# Patient Record
Sex: Male | Born: 1990 | Hispanic: No | Marital: Married | Smoking: Never smoker
Health system: Southern US, Community
[De-identification: ages and names within clinical notes are randomized; demographics above are authoritative.]

## PROBLEM LIST (undated history)

## (undated) DIAGNOSIS — E119 Type 2 diabetes mellitus without complications: Secondary | ICD-10-CM

---

## 2016-06-21 ENCOUNTER — Emergency Department (HOSPITAL_BASED_OUTPATIENT_CLINIC_OR_DEPARTMENT_OTHER)
Admission: EM | Admit: 2016-06-21 | Discharge: 2016-06-21 | Disposition: A | Discharge status: Home / Self Care | Payer: PPO | Attending: Emergency Medicine | Admitting: Emergency Medicine

## 2016-06-21 ENCOUNTER — Encounter (HOSPITAL_BASED_OUTPATIENT_CLINIC_OR_DEPARTMENT_OTHER): Payer: Self-pay | Admitting: *Deleted

## 2016-06-21 DIAGNOSIS — E119 Type 2 diabetes mellitus without complications: Secondary | ICD-10-CM | POA: Diagnosis not present

## 2016-06-21 DIAGNOSIS — Z794 Long term (current) use of insulin: Secondary | ICD-10-CM | POA: Insufficient documentation

## 2016-06-21 DIAGNOSIS — T7840XA Allergy, unspecified, initial encounter: Secondary | ICD-10-CM | POA: Insufficient documentation

## 2016-06-21 DIAGNOSIS — R21 Rash and other nonspecific skin eruption: Secondary | ICD-10-CM | POA: Diagnosis present

## 2016-06-21 HISTORY — DX: Type 2 diabetes mellitus without complications: E11.9

## 2016-06-21 LAB — CBG MONITORING, ED: Glucose-Capillary: 242 mg/dL — ABNORMAL HIGH (ref 65–99)

## 2016-06-21 MED ORDER — PREDNISONE 50 MG PO TABS: ORAL_TABLET | ORAL | 0 refills | Status: DC

## 2016-06-21 MED ORDER — DIPHENHYDRAMINE HCL 25 MG PO CAPS
50.0000 mg | ORAL_CAPSULE | Freq: Once | ORAL | Status: AC
  Administered 2016-06-21: 50 mg via ORAL
  Filled 2016-06-21: qty 2

## 2016-06-21 MED ORDER — DIPHENHYDRAMINE HCL 25 MG PO CAPS: 25.0000 mg | ORAL_CAPSULE | Freq: Three times a day (TID) | ORAL | 0 refills | Status: DC | PRN

## 2016-06-21 MED ORDER — FAMOTIDINE 20 MG PO TABS
20.0000 mg | ORAL_TABLET | Freq: Once | ORAL | Status: AC
Start: 2016-06-21 — End: 2016-06-21
  Administered 2016-06-21: 20 mg via ORAL
  Filled 2016-06-21: qty 1

## 2016-06-21 MED ORDER — PREDNISONE 50 MG PO TABS
60.0000 mg | ORAL_TABLET | Freq: Once | ORAL | Status: AC
  Administered 2016-06-21: 60 mg via ORAL
  Filled 2016-06-21: qty 1

## 2016-06-21 NOTE — ED Triage Notes (Addendum)
allergic reaction to "royal jelly" consumed at 0345, redness, itching swelling and sob onset w/in 5 minutes, no meds PTA (LS CTA, no obvious oral swelling), h/o DM, last cbg at 0350 was 285. Used novopen insulin at 0000 (20 units). [place] from [place], Consulting civil engineerstudent at [employer], speaks little [place], friend at [employer] translating.

## 2016-06-21 NOTE — Progress Notes (Signed)
Patient's bilateral breath sounds are clear.  No evidence of stridor present.

## 2016-06-21 NOTE — Discharge Instructions (Signed)
PLEASE BE SURE TO CHECK YOUR SUGAR WHILE TAKING PREDNISONE AND TO ADJUST YOUR INSULIN TO COVER ANY INCREASE

## 2016-06-21 NOTE — ED Notes (Addendum)
CBG 242 

## 2016-06-21 NOTE — ED Notes (Signed)
Dr. Wickline at [facility].  

## 2016-06-21 NOTE — ED Provider Notes (Signed)
[facility]   CSN: [id number] Arrival date & time: 06/21/16  0359     History   Chief Complaint Chief Complaint  Patient presents with  . Allergic Reaction    HPI Zachary Hicks is a 25 y.o. male.  The history is provided by the patient and a friend.  Allergic Reaction  Presenting symptoms: itching and rash   Severity:  Moderate Duration:  1 hour Prior allergic episodes:  No prior episodes Worsened by:  Nothing  Patient presents with allergic reaction He was taking "royal jelly" to help control his glucose as he has diabetes Almost immediately after he developed full body rash and itching No sob No facial/tongue swelling reported No vomiting He has never had this reaction before Past Medical History:  Diagnosis Date  . Diabetes mellitus without complication (HCC)     There are no active problems to display for this patient.   History reviewed. No pertinent surgical history.     Home Medications    Prior to Admission medications   Medication Sig Start Date End Date Taking? Authorizing Provider  Insulin Aspart (NOVOLOG FLEXPEN Athalia) Inject into the skin.   Yes Historical Provider, MD  diphenhydrAMINE (BENADRYL) 25 mg capsule Take 1 capsule (25 mg total) by mouth every 8 (eight) hours as needed for itching. 06/21/16   Zadie Rhineonald Gil Ingwersen, MD  predniSONE (DELTASONE) 50 MG tablet 1 tablet PO QD X4 days 06/21/16   Zadie Rhineonald Jaala Bohle, MD    Family History History reviewed. No pertinent family history.  Social History Social History  Substance Use Topics  . Smoking status: Never Smoker  . Smokeless tobacco: Never Used  . Alcohol use No     Allergies   Review of patient's allergies indicates no known allergies.   Review of Systems Review of Systems  Constitutional: Negative for fever.  Respiratory: Negative for shortness of breath.   Skin: Positive for itching and rash.  All other systems reviewed and are negative.    Physical  Exam Updated Vital Signs BP (!) 113/101   Pulse 90   Temp 97.6 F (36.4 C) (Oral)   Ht 5\' 6"  (1.676 m)   Wt 90 kg   SpO2 96%   BMI 32.02 kg/m   Physical Exam  CONSTITUTIONAL: Well developed/well nourished HEAD: Normocephalic/atraumatic EYES: EOMI/PERRL ENMT: Mucous membranes moist, no angioedema noted NECK: supple no meningeal signs SPINE/BACK:entire spine nontender CV: S1/S2 noted, no murmurs/rubs/gallops noted LUNGS: Lungs are clear to auscultation bilaterally, no apparent distress ABDOMEN: soft, nontender NEURO: Pt is awake/alert/appropriate, moves all extremitiesx4.  No facial droop.   EXTREMITIES: pulses normal/equal, full ROM SKIN: warm, color normal, no rash noted PSYCH: no abnormalities of mood noted, alert and oriented to situation  ED Treatments / Results  Labs (all labs ordered are listed, but only abnormal results are displayed) Labs Reviewed  CBG MONITORING, ED - Abnormal; Notable for the following:       Result Value   Glucose-Capillary 242 (*)    All other components within normal limits    EKG  EKG Interpretation None       Radiology No results found.  Procedures Procedures (including critical care time)  Medications Ordered in ED Medications  diphenhydrAMINE (BENADRYL) capsule 50 mg (50 mg Oral Given 06/21/16 0446)  predniSONE (DELTASONE) tablet 60 mg (60 mg Oral Given 06/21/16 0446)  famotidine (PEPCID) tablet 20 mg (20 mg Oral Given 06/21/16 0446)     Initial Impression / Assessment and Plan / ED Course  I have reviewed the triage vital signs and the nursing notes.  Pertinent labs  results that were available during my care of the patient were reviewed by me and considered in my medical decision making (see chart for details).  Clinical Course    By the time of my evaluation after he received meds his rash resolved He is in no distress No angioedema, no wheezing Will place on prednisone (advised to monitor glucose and use sliding  scale insulin)  And also place on benadryl  Pt speaks arabic but also speaks some english He refused to use phone interpreter and insisted on using his friend to interpret  Final Clinical Impressions(s) / ED Diagnoses   Final diagnoses:  Allergic reaction, initial encounter    New Prescriptions New Prescriptions   DIPHENHYDRAMINE (BENADRYL) 25 MG CAPSULE    Take 1 capsule (25 mg total) by mouth every 8 (eight) hours as needed for itching.   PREDNISONE (DELTASONE) 50 MG TABLET    1 tablet PO QD X4 days     Zadie Rhineonald Lottie Sigman, MD 06/21/16 [id number]

## 2017-11-23 ENCOUNTER — Ambulatory Visit (INDEPENDENT_AMBULATORY_CARE_PROVIDER_SITE_OTHER): Payer: PPO | Admitting: Family Medicine

## 2017-11-23 ENCOUNTER — Encounter: Payer: Self-pay | Admitting: Family Medicine

## 2017-11-23 VITALS — BP 145/87 | HR 83 | Temp 97.8°F | Resp 16 | Ht 67.58 in | Wt 208.4 lb

## 2017-11-23 DIAGNOSIS — E6609 Other obesity due to excess calories: Secondary | ICD-10-CM

## 2017-11-23 DIAGNOSIS — R0981 Nasal congestion: Secondary | ICD-10-CM | POA: Diagnosis not present

## 2017-11-23 DIAGNOSIS — R5382 Chronic fatigue, unspecified: Secondary | ICD-10-CM

## 2017-11-23 DIAGNOSIS — R21 Rash and other nonspecific skin eruption: Secondary | ICD-10-CM | POA: Diagnosis not present

## 2017-11-23 DIAGNOSIS — G63 Polyneuropathy in diseases classified elsewhere: Secondary | ICD-10-CM

## 2017-11-23 DIAGNOSIS — Z6832 Body mass index (BMI) 32.0-32.9, adult: Secondary | ICD-10-CM

## 2017-11-23 DIAGNOSIS — E1042 Type 1 diabetes mellitus with diabetic polyneuropathy: Secondary | ICD-10-CM | POA: Diagnosis not present

## 2017-11-23 DIAGNOSIS — E785 Hyperlipidemia, unspecified: Secondary | ICD-10-CM

## 2017-11-23 DIAGNOSIS — R030 Elevated blood-pressure reading, without diagnosis of hypertension: Secondary | ICD-10-CM

## 2017-11-23 DIAGNOSIS — R0683 Snoring: Secondary | ICD-10-CM

## 2017-11-23 LAB — POCT URINALYSIS DIP (MANUAL ENTRY)
Bilirubin, UA: NEGATIVE
Blood, UA: NEGATIVE
Glucose, UA: >=1000 mg/dL — AB
Leukocytes, UA: NEGATIVE
Nitrite, UA: NEGATIVE
PH UA: 6.5 (ref 5.0–8.0)
PROTEIN UA: NEGATIVE mg/dL
SPEC GRAV UA: 1.02 (ref 1.010–1.025)
Urobilinogen, UA: 0.2 E.U./dL

## 2017-11-23 LAB — GLUCOSE, POCT (MANUAL RESULT ENTRY): POC Glucose: 223 mg/dl — AB (ref 70–99)

## 2017-11-23 LAB — POCT GLYCOSYLATED HEMOGLOBIN (HGB A1C): HEMOGLOBIN A1C: 8.6

## 2017-11-23 MED ORDER — LISINOPRIL 10 MG PO TABS
10.0000 mg | ORAL_TABLET | Freq: Every day | ORAL | 1 refills | Status: DC
Start: 2017-11-23 — End: 2018-01-18

## 2017-11-23 MED ORDER — ASPIRIN EC 81 MG PO TBEC: 81.0000 mg | DELAYED_RELEASE_TABLET | Freq: Every day | ORAL | Status: DC

## 2017-11-23 MED ORDER — AZELASTINE HCL 0.1 % NA SOLN: 2.0000 | Freq: Every day | NASAL | 5 refills | Status: DC

## 2017-11-23 MED ORDER — HYDROCORTISONE 2.5 % EX OINT: TOPICAL_OINTMENT | Freq: Two times a day (BID) | CUTANEOUS | 0 refills | Status: DC

## 2017-11-23 MED ORDER — BASAGLAR KWIKPEN 100 UNIT/ML ~~LOC~~ SOPN: 55.0000 [IU] | PEN_INJECTOR | Freq: Every day | SUBCUTANEOUS | 4 refills | Status: DC

## 2017-11-23 MED ORDER — INSULIN LISPRO 200 UNIT/ML ~~LOC~~ SOPN: 20.0000 [IU] | PEN_INJECTOR | Freq: Three times a day (TID) | SUBCUTANEOUS | 2 refills | Status: DC

## 2017-11-23 MED ORDER — GABAPENTIN 300 MG PO CAPS: 300.0000 mg | ORAL_CAPSULE | Freq: Every day | ORAL | 1 refills | Status: DC

## 2017-11-23 NOTE — Patient Instructions (Addendum)
Start using a shampoo with selenium sulfide 5% in it and use it as a face wash to for the next 3 weeks.  Then can start topical treatment with hydrocortisone ointment but stop using this whenever the rash goes away and the switch to a good over-the-counter moisturizer without scent or color like  Eucerin, Cedaphil, or Aquaphor.  If you overuse the hydrocortisone ointment after your rash goes away, it could eventually cause permanent thinned and/or lightened areas of your skin.   IF you received an x-ray today, you will receive an invoice from [facility]. Please contact [facility] at [phone] with questions or concerns regarding your invoice.   IF you received labwork today, you will receive an invoice from [facility]. Please contact [facility] at [phone] with questions or concerns regarding your invoice.   Our billing staff will not be able to assist you with questions regarding bills from these companies.  You will be contacted with the lab results as soon as they are available. The fastest way to get your results is to activate your My Chart account. Instructions are located on the last page of this paperwork. If you have not heard from [place] regarding the results in 2 weeks, please contact this office.      Diabetes Mellitus and Standards of Medical Care Managing diabetes (diabetes mellitus) can be complicated. Your diabetes treatment may be managed by a team of health care providers, including:  A diet and nutrition specialist (registered dietitian).  A nurse.  A certified diabetes educator (CDE).  A diabetes specialist (endocrinologist).  An eye doctor.  A primary care provider.  A dentist.  Your health care providers follow a schedule in order to help you get the best quality of care. The following schedule is a general guideline for your diabetes management plan. Your health care providers may also give you more specific instructions. HbA1c ( hemoglobin  A1c) test This test provides information about blood sugar (glucose) control over the previous 2-3 months. It is used to check whether your diabetes management plan needs to be adjusted.  If you are meeting your treatment goals, this test is done at least 2 times a year.  If you are not meeting treatment goals or if your treatment goals have changed, this test is done 4 times a year.  Blood pressure test  This test is done at every routine medical visit. For most people, the goal is less than 130/80. Ask your health care provider what your goal blood pressure should be. Dental and eye exams  Visit your dentist two times a year.  If you have type 1 diabetes, get an eye exam 3-5 years after you are diagnosed, and then once a year after your first exam. ? If you were diagnosed with type 1 diabetes as a child, get an eye exam when you are age 46 or older and have had diabetes for 3-5 years. After the first exam, you should get an eye exam once a year.  If you have type 2 diabetes, have an eye exam as soon as you are diagnosed, and then once a year after your first exam. Foot care exam  Visual foot exams are done at every routine medical visit. The exams check for cuts, bruises, redness, blisters, sores, or other problems with the feet.  A complete foot exam is done by your health care provider once a year. This exam includes an inspection of the structure and skin of your feet, and a check of  the pulses and sensation in your feet. ? Type 1 diabetes: Get your first exam 3-5 years after diagnosis. ? Type 2 diabetes: Get your first exam as soon as you are diagnosed.  Check your feet every day for cuts, bruises, redness, blisters, or sores. If you have any of these or other problems that are not healing, contact your health care provider. Kidney function test ( urine microalbumin)  This test is done once a year. ? Type 1 diabetes: Get your first test 5 years after diagnosis. ? Type 2  diabetes: Get your first test as soon as you are diagnosed.  If you have chronic kidney disease (CKD), get a serum creatinine and estimated glomerular filtration rate (eGFR) test once a year. Lipid profile (cholesterol, HDL, LDL, triglycerides)  This test should be done when you are diagnosed with diabetes, and every 5 years after the first test. If you are on medicines to lower your cholesterol, you may need to get this test done every year. ? The goal for LDL is less than 100 mg/dL (5.5 mmol/L). If you are at high risk, the goal is less than 70 mg/dL (3.9 mmol/L). ? The goal for HDL is 40 mg/dL (2.2 mmol/L) for men and 50 mg/dL(2.8 mmol/L) for women. An HDL cholesterol of 60 mg/dL (3.3 mmol/L) or higher gives some protection against heart disease. ? The goal for triglycerides is less than 150 mg/dL (8.3 mmol/L). Immunizations  The yearly flu (influenza) vaccine is recommended for everyone 6 months or older who has diabetes.  The pneumonia (pneumococcal) vaccine is recommended for everyone 2 years or older who has diabetes. If you are 26 or older, you may get the pneumonia vaccine as a series of two separate shots.  The hepatitis B vaccine is recommended for adults shortly after they have been diagnosed with diabetes.  The Tdap (tetanus, diphtheria, and pertussis) vaccine should be given: ? According to normal childhood vaccination schedules, for children. ? Every 10 years, for adults who have diabetes.  The shingles vaccine is recommended for people who have had chicken pox and are 50 years or older. Mental and emotional health  Screening for symptoms of eating disorders, anxiety, and depression is recommended at the time of diagnosis and afterward as needed. If your screening shows that you have symptoms (you have a positive screening result), you may need further evaluation and be referred to a mental health care provider. Diabetes self-management education  Education about how to  manage your diabetes is recommended at diagnosis and ongoing as needed. Treatment plan  Your treatment plan will be reviewed at every medical visit. Summary  Managing diabetes (diabetes mellitus) can be complicated. Your diabetes treatment may be managed by a team of health care providers.  Your health care providers follow a schedule in order to help you get the best quality of care.  Standards of care including having regular physical exams, blood tests, blood pressure monitoring, immunizations, screening tests, and education about how to manage your diabetes.  Your health care providers may also give you more specific instructions based on your individual health. This information is not intended to replace advice given to you by your health care provider. Make sure you discuss any questions you have with your health care provider. Document Released: 08/15/2009 Document Revised: 07/16/2016 Document Reviewed: 07/16/2016 Elsevier Interactive Patient Education  2018 [employer].  Diabetic Neuropathy Diabetic neuropathy is a nerve disease or nerve damage that is caused by diabetes mellitus. About half of all  people with diabetes mellitus have some form of nerve damage. Nerve damage is more common in those who have had diabetes mellitus for many years and who generally have not had good control of their blood sugar (glucose) level. Diabetic neuropathy is a common complication of diabetes mellitus. There are three common types of diabetic neuropathy and a fourth type that is less common and less understood:  Peripheral neuropathy-This is the most common type of diabetic neuropathy. It causes damage to the nerves of the feet and legs first and then eventually the hands and arms. The damage affects the ability to sense touch.  Autonomic neuropathy-This type causes damage to the autonomic nervous system, which controls the following functions: ? Heartbeat. ? Body temperature. ? Blood  pressure. ? Urination. ? Digestion. ? Sweating. ? Sexual function.  Focal neuropathy-Focal neuropathy can be painful and unpredictable and occurs most often in older adults with diabetes mellitus. It involves a specific nerve or one area and often comes on suddenly. It usually does not cause long-term problems.  Radiculoplexus neuropathy- Sometimes called lumbosacral radiculoplexus neuropathy, radiculoplexus neuropathy affects the nerves of the thighs, hips, buttocks, or legs. It is more common in people with type 2 diabetes mellitus and in older men. It is characterized by debilitating pain, weakness, and atrophy, usually in the thigh muscles.  What are the causes? The cause of peripheral, autonomic, and focal neuropathies is diabetes mellitus that is uncontrolled and high glucose levels. The cause of radiculoplexus neuropathy is unknown. However, it is thought to be caused by inflammation related to uncontrolled glucose levels. What are the signs or symptoms? Peripheral Neuropathy Peripheral neuropathy develops slowly over time. When the nerves of the feet and legs no longer work there may be:  Burning, stabbing, or aching pain in the legs or feet.  Inability to feel pressure or pain in your feet. This can lead to: ? Thick calluses over pressure areas. ? Pressure sores. ? Ulcers.  Foot deformities.  Reduced ability to feel temperature changes.  Muscle weakness.  Autonomic Neuropathy The symptoms of autonomic neuropathy vary depending on which nerves are affected. Symptoms may include:  Problems with digestion, such as: ? Feeling sick to your stomach (nausea). ? Vomiting. ? Bloating. ? Constipation. ? Diarrhea. ? Abdominal pain.  Difficulty with urination. This occurs if you lose your ability to sense when your bladder is full. Problems include: ? Urine leakage (incontinence). ? Inability to empty your bladder completely (retention).  Rapid or irregular heartbeat  (palpitations).  Blood pressure drops when you stand up (orthostatic hypotension). When you stand up you may feel: ? Dizzy. ? Weak. ? Faint.  In men, inability to attain and maintain an erection.  In women, vaginal dryness and problems with decreased sexual desire and arousal.  Problems with body temperature regulation.  Increased or decreased sweating.  Focal Neuropathy  Abnormal eye movements or abnormal alignment of both eyes.  Weakness in the wrist.  Foot drop. This results in an inability to lift the foot properly and abnormal walking or foot movement.  Paralysis on one side of your face (Bell palsy).  Chest or abdominal pain. Radiculoplexus Neuropathy  Sudden, severe pain in your hip, thigh, or buttocks.  Weakness and wasting of thigh muscles.  Difficulty rising from a seated position.  Abdominal swelling.  Unexplained weight loss (usually more than 10 lb [4.5 kg]). How is this diagnosed? Peripheral Neuropathy Your senses may be tested. Sensory function testing can be done with:  A light touch  using a monofilament.  A vibration with tuning fork.  A sharp sensation with a pin prick.  Other tests that can help diagnose neuropathy are:  Nerve conduction velocity. This test checks the transmission of an electrical current through a nerve.  Electromyography. This shows how muscles respond to electrical signals transmitted by nearby nerves.  Quantitative sensory testing. This is used to assess how your nerves respond to vibrations and changes in temperature.  Autonomic Neuropathy Diagnosis is often based on reported symptoms. Tell your health care provider if you experience:  Dizziness.  Constipation.  Diarrhea.  Inappropriate urination or inability to urinate.  Inability to get or maintain an erection.  Tests that may be done include:  Electrocardiography or Holter monitor. These are tests that can help show problems with the heart rate or heart  rhythm.  An X-ray exam may be done.  Focal Neuropathy Diagnosis is made based on your symptoms and what your health care provider finds during your exam. Other tests may be done. They may include:  Nerve conduction velocities. This checks the transmission of electrical current through a nerve.  Electromyography. This shows how muscles respond to electrical signals transmitted by nearby nerves.  Quantitative sensory testing. This test is used to assess how your nerves respond to vibration and changes in temperature.  Radiculoplexus Neuropathy  Often the first thing is to eliminate any other issue or problems that might be the cause, as there is no standard test for diagnosis.  X-ray exam of your spine and lumbar region.  Spinal tap to rule out cancer.  MRI to rule out other lesions. How is this treated? Once nerve damage occurs, it cannot be reversed. The goal of treatment is to keep the disease or nerve damage from getting worse and affecting more nerve fibers. Controlling your blood glucose level is the key. Most people with radiculoplexus neuropathy see at least a partial improvement over time. You will need to keep your blood glucose and HbA1c levels in the target range determined by your health care provider. Things that help control blood glucose levels include:  Blood glucose monitoring.  Meal planning.  Physical activity.  Diabetes medicine.  Over time, maintaining lower blood glucose levels helps lessen symptoms. Sometimes, prescription pain medicine is needed. Follow these instructions at home:  Do not smoke.  Keep your blood glucose level in the range that you and your health care provider have determined acceptable for you.  Keep your blood pressure level in the range that you and your health care provider have determined acceptable for you.  Eat a well-balanced diet.  Be physically active every day. Include strength training and balance exercises.  Protect  your feet. ? Check your feet every day for sores, cuts, blisters, or signs of infection. ? Wear padded socks and supportive shoes. Use orthotic inserts, if necessary. ? Regularly check the insides of your shoes for worn spots. Make sure there are no rocks or other items inside your shoes before you put them on. Contact a health care provider if:  You have burning, stabbing, or aching pain in the legs or feet.  You are unable to feel pressure or pain in your feet.  You develop problems with digestion such as: ? Nausea. ? Vomiting. ? Bloating. ? Constipation. ? Diarrhea. ? Abdominal pain.  You have difficulty with urination, such as: ? Incontinence. ? Retention.  You have palpitations.  You develop orthostatic hypotension. When you stand up you may feel: ? Dizzy. ? Weak. ?  Faint.  You cannot attain and maintain an erection (in men).  You have vaginal dryness and problems with decreased sexual desire and arousal (in women).  You have severe pain in your thighs, legs, or buttocks.  You have unexplained weight loss. This information is not intended to replace advice given to you by your health care provider. Make sure you discuss any questions you have with your health care provider. Document Released: 12/27/2001 Document Revised: 03/25/2016 Document Reviewed: 03/29/2013 Elsevier Interactive Patient Education  2017 [employer].

## 2017-11-23 NOTE — Progress Notes (Signed)
Saw patient for glucometer teaching at the request of Dr. Clelia CroftShaw. Assisted patient with application of freestyle libre continuous monitor patch and glucometer setup. Able to demonstrate to RN how to use glucometer with patch. Encouraged him to call to return to clinic with any questions or concerns.

## 2017-11-23 NOTE — Progress Notes (Signed)
Subjective:    Patient ID: Zachary Hicks, male    DOB: 01/27/1991, 27 y.o.   MRN: [id number] Chief Complaint  Patient presents with  . Establish Care    patient is fasting  . Other    wants to be tested for diabetes; was told last year he had diabetes    HPI Has been seen at Dr. Earlie Server at his clinic but he has been lost to f/u for a while.  He was diagnosed at 27 yo and started directly on insulin. No DM in family. Very compliant with once a day basal insulin and three times a day meal time insulin.  Checks cbgs qd.  He is currently on levemir 55u qhs and novolog 20u tidac but his ins wants him to change to basaglar and humalog quick pen.   He is wanting to start exercise more - trying for 1 hr daily as he has gained weight. He admits that he does not do a great diabetic diet.   Last yr a1c was 9. Does get burning in feet sometimes but none in hands.  Sees ophtho annually - last seen January 2018 and told dry eyes and rx'd drops but made sxs worse.  Is going to move back to [place]. 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 Has only had one low over the past month since he has started exercising.  Would like a medication for the neuropathy DMII: Diagnosed .   No results found for: HGBA1C CBGs: fasting a.m. ; after meal  ; No hypoglycemic episodes.  Meter type:  Diet:  Exercising:  DM Med Regimen: Prior changes:   eGFR:  Baseline Cr:  Last checked . Microalb: Done . Normal. On acei.   Lipids:  LDL ,  non-HDL .  Last levels done  - were improving from prior. On statin. Taking asa 81 qd.  Optho: Seen annually by - last exam  Feet: Monofilament exam done . Denies any no problems.  Not seen by podiatry prior.  Immunizations:  Influenza:  Pneumovax-23:  Told possible glaucoma - gets more when he travels with collapse from swelling.   Past Medical History:  Diagnosis Date  . Diabetes mellitus without complication ([place])    No past surgical history on file. Current  Outpatient Medications on File Prior to Visit  Medication Sig Dispense Refill  . insulin detemir (LEVEMIR) 100 UNIT/ML injection Inject into the skin at bedtime.    . Insulin Aspart (NOVOLOG FLEXPEN [place]) Inject into the skin.     No current facility-administered medications on file prior to visit.    No Known Allergies No family history on file. Social History   Socioeconomic History  . Marital status: Single    Spouse name: None  . Number of children: None  . Years of education: None  . Highest education level: None  Social Needs  . Financial resource strain: None  . Food insecurity - worry: None  . Food insecurity - inability: None  . Transportation needs - medical: None  . Transportation needs - non-medical: None  Occupational History  . None  Tobacco Use  . Smoking status: Never Smoker  . Smokeless tobacco: Never Used  Substance and Sexual Activity  . Alcohol use: No  . Drug use: No  . Sexual activity: None  Other Topics Concern  . None  Social History Narrative  . None   No flowsheet data found.   Review of Systems See hpi    Objective:   Physical Exam  Constitutional: He is oriented to person, place, and time. He appears well-developed and well-nourished. No distress.  HENT:  Head: Normocephalic and atraumatic.  Eyes: Conjunctivae are normal. Pupils are equal, round, and reactive to light. No scleral icterus.  Neck: Normal range of motion. Neck supple. No thyromegaly present.  Cardiovascular: Normal rate, regular rhythm, normal heart sounds and intact distal pulses.  Pulmonary/Chest: Effort normal and breath sounds normal. No respiratory distress.  Musculoskeletal: He exhibits no edema.  Lymphadenopathy:    He has no cervical adenopathy.  Neurological: He is alert and oriented to person, place, and time.  Skin: Skin is warm and dry. He is not diaphoretic.  Psychiatric: He has a normal mood and affect. His behavior is normal.    Diabetic Foot Exam -  Simple   Simple Foot Form Diabetic Foot exam was performed with the following findings:  Yes 11/23/2017  2:53 PM  Visual Inspection No deformities, no ulcerations, no other skin breakdown bilaterally:  Yes Sensation Testing Intact to touch and monofilament testing bilaterally:  Yes Pulse Check Posterior Tibialis and Dorsalis pulse intact bilaterally:  Yes Comments      BP 140/89   Pulse 83   Temp 97.8 F (36.6 C) (Oral)   Resp 16   Ht 5' 7.58" (1.717 m)   Wt 208 lb 6.4 oz (94.5 kg)   SpO2 96%   BMI 32.08 kg/m      Results for orders placed or performed in visit on 11/23/17  POCT glucose (manual entry)  Result Value Ref Range   POC Glucose 223 (A) 70 - 99 mg/dl  POCT glycosylated hemoglobin (Hb A1C)  Result Value Ref Range   Hemoglobin A1C 8.6     Assessment & Plan:   1. Type 1 diabetes mellitus with diabetic polyneuropathy ([place]) - refer to endocrine - would be excellent candidate for adult insulin pump.  Will start trying to use the freestyle libre meter so he can check his cbgs much more freq.  If he is going to work-out - try taking 1/2 dose of mealtime insulin rather than skipping it all together.  2. Class 1 obesity due to excess calories with serious comorbidity and body mass index (BMI) of 32.0 to 32.9 in adult   3. Elevated blood pressure reading   4. Chronic fatigue   5. Polyneuropathy associated with underlying disease ([place])   6. Hyperlipidemia LDL goal <100   7. Facial rash   8. Chronic nasal congestion   9. Snoring     Orders Placed This Encounter  Procedures  . Comprehensive metabolic panel  . Lipid panel  . Microalbumin/Creatinine Ratio, Urine  . TSH  . Vitamin B12  . VITAMIN D 25 Hydroxy (Vit-D Deficiency, Fractures)  . CBC with Differential/Platelet  . CBC with Differential/Platelet  . VITAMIN D 25 Hydroxy (Vit-D Deficiency, Fractures)  . Vitamin B12  . Ambulatory referral to Endocrinology    Referral Priority:   Routine    Referral Type:    Consultation    Referral Reason:   Specialty Services Required    Number of Visits Requested:   1  . Care order/instruction:    Scheduling Instructions:     Recheck BP  . POCT glucose (manual entry)  . POCT glycosylated hemoglobin (Hb A1C)  . POCT urinalysis dipstick  . HM DIABETES FOOT EXAM    Meds ordered this encounter  Medications  . azelastine (ASTELIN) 0.1 % nasal spray    Sig: Place 2 sprays into both nostrils  at bedtime. Use in each nostril as directed    Dispense:  30 mL    Refill:  5  . hydrocortisone 2.5 % ointment    Sig: Apply topically 2 (two) times daily.    Dispense:  30 g    Refill:  0  . gabapentin (NEURONTIN) 300 MG capsule    Sig: Take 1 capsule (300 mg total) by mouth at bedtime.    Dispense:  90 capsule    Refill:  1  . lisinopril (PRINIVIL,ZESTRIL) 10 MG tablet    Sig: Take 1 tablet (10 mg total) by mouth daily.    Dispense:  30 tablet    Refill:  1  . aspirin EC 81 MG tablet    Sig: Take 1 tablet (81 mg total) by mouth daily.  . Insulin Glargine (BASAGLAR KWIKPEN) 100 UNIT/ML SOPN    Sig: Inject 0.55 mLs (55 Units total) into the skin at bedtime.    Dispense:  15 mL    Refill:  4  . Insulin Lispro (HUMALOG KWIKPEN) 200 UNIT/ML SOPN    Sig: Inject 20 Units into the skin 3 (three) times daily before meals.    Dispense:  30 mL    Refill:  2    Delman Cheadle, M.D.  Primary Care at [facility] [address] [place], [place] [address] [phone] phone [phone] fax  11/25/17 7:37 PM

## 2017-11-24 LAB — TSH: TSH: 4.05 u[IU]/mL (ref 0.450–4.500)

## 2017-11-24 LAB — COMPREHENSIVE METABOLIC PANEL
A/G RATIO: 1.6 (ref 1.2–2.2)
ALBUMIN: 4.7 g/dL (ref 3.5–5.5)
ALT: 24 IU/L (ref 0–44)
AST: 21 IU/L (ref 0–40)
Alkaline Phosphatase: 91 IU/L (ref 39–117)
BILIRUBIN TOTAL: 0.5 mg/dL (ref 0.0–1.2)
BUN / CREAT RATIO: 14 (ref 9–20)
BUN: 11 mg/dL (ref 6–20)
CO2: 24 mmol/L (ref 20–29)
CREATININE: 0.79 mg/dL (ref 0.76–1.27)
Calcium: 9.7 mg/dL (ref 8.7–10.2)
Chloride: 97 mmol/L (ref 96–106)
GFR calc Af Amer: 142 mL/min/{1.73_m2} (ref >59)
GFR calc non Af Amer: 123 mL/min/{1.73_m2} (ref >59)
GLOBULIN, TOTAL: 2.9 g/dL (ref 1.5–4.5)
Glucose: 244 mg/dL — ABNORMAL HIGH (ref 65–99)
POTASSIUM: 4.3 mmol/L (ref 3.5–5.2)
SODIUM: 136 mmol/L (ref 134–144)
Total Protein: 7.6 g/dL (ref 6.0–8.5)

## 2017-11-24 LAB — MICROALBUMIN / CREATININE URINE RATIO
Creatinine, Urine: 109.3 mg/dL
MICROALB/CREAT RATIO: 8 mg/g{creat} (ref 0.0–30.0)
MICROALBUM., U, RANDOM: 8.7 ug/mL

## 2017-11-24 LAB — CBC WITH DIFFERENTIAL/PLATELET
Basophils Absolute: 0 10*3/uL (ref 0.0–0.2)
Basos: 1 %
EOS (ABSOLUTE): 0.2 10*3/uL (ref 0.0–0.4)
Eos: 5 %
Hematocrit: 46.8 % (ref 37.5–51.0)
Hemoglobin: 15.4 g/dL (ref 13.0–17.7)
Immature Grans (Abs): 0 10*3/uL (ref 0.0–0.1)
Immature Granulocytes: 0 %
Lymphocytes Absolute: 1.8 10*3/uL (ref 0.7–3.1)
Lymphs: 34 %
MCH: 29.4 pg (ref 26.6–33.0)
MCHC: 32.9 g/dL (ref 31.5–35.7)
MCV: 90 fL (ref 79–97)
MONOS ABS: 0.4 10*3/uL (ref 0.1–0.9)
Monocytes: 7 %
NEUTROS ABS: 2.9 10*3/uL (ref 1.4–7.0)
NEUTROS PCT: 53 %
PLATELETS: 245 10*3/uL (ref 150–379)
RBC: 5.23 x10E6/uL (ref 4.14–5.80)
RDW: 13.4 % (ref 12.3–15.4)
WBC: 5.3 10*3/uL (ref 3.4–10.8)

## 2017-11-24 LAB — LIPID PANEL
CHOL/HDL RATIO: 5.8 ratio — AB (ref 0.0–5.0)
CHOLESTEROL TOTAL: 204 mg/dL — AB (ref 100–199)
HDL: 35 mg/dL — ABNORMAL LOW (ref >39)
LDL CALC: 120 mg/dL — AB (ref 0–99)
Triglycerides: 243 mg/dL — ABNORMAL HIGH (ref 0–149)
VLDL Cholesterol Cal: 49 mg/dL — ABNORMAL HIGH (ref 5–40)

## 2017-11-24 LAB — VITAMIN B12: Vitamin B-12: 314 pg/mL (ref 232–1245)

## 2017-11-24 LAB — VITAMIN D 25 HYDROXY (VIT D DEFICIENCY, FRACTURES): Vit D, 25-Hydroxy: 19.3 ng/mL — ABNORMAL LOW (ref 30.0–100.0)

## 2017-11-25 ENCOUNTER — Telehealth: Payer: Self-pay | Admitting: Family Medicine

## 2017-11-25 NOTE — Telephone Encounter (Signed)
Pt came in wanting to ask questions about the medication that was prescribed on 11/23/2017 by Dr. Clelia CroftShaw.

## 2017-11-26 ENCOUNTER — Encounter: Payer: Self-pay | Admitting: Family Medicine

## 2017-11-26 NOTE — Telephone Encounter (Signed)
See Mychart message dated 11/26/17.

## 2017-12-22 ENCOUNTER — Other Ambulatory Visit: Payer: Self-pay

## 2017-12-22 ENCOUNTER — Ambulatory Visit (INDEPENDENT_AMBULATORY_CARE_PROVIDER_SITE_OTHER): Payer: PPO | Admitting: Family Medicine

## 2017-12-22 ENCOUNTER — Encounter: Payer: Self-pay | Admitting: Family Medicine

## 2017-12-22 VITALS — BP 110/82 | HR 92 | Temp 98.1°F | Resp 16 | Ht 66.5 in | Wt 213.6 lb

## 2017-12-22 DIAGNOSIS — R030 Elevated blood-pressure reading, without diagnosis of hypertension: Secondary | ICD-10-CM

## 2017-12-22 DIAGNOSIS — Z5181 Encounter for therapeutic drug level monitoring: Secondary | ICD-10-CM | POA: Diagnosis not present

## 2017-12-22 DIAGNOSIS — G63 Polyneuropathy in diseases classified elsewhere: Secondary | ICD-10-CM | POA: Diagnosis not present

## 2017-12-22 DIAGNOSIS — E1042 Type 1 diabetes mellitus with diabetic polyneuropathy: Secondary | ICD-10-CM

## 2017-12-22 DIAGNOSIS — S39012A Strain of muscle, fascia and tendon of lower back, initial encounter: Secondary | ICD-10-CM

## 2017-12-22 DIAGNOSIS — E785 Hyperlipidemia, unspecified: Secondary | ICD-10-CM

## 2017-12-22 DIAGNOSIS — E559 Vitamin D deficiency, unspecified: Secondary | ICD-10-CM

## 2017-12-22 DIAGNOSIS — E538 Deficiency of other specified B group vitamins: Secondary | ICD-10-CM | POA: Diagnosis not present

## 2017-12-22 DIAGNOSIS — R7989 Other specified abnormal findings of blood chemistry: Secondary | ICD-10-CM

## 2017-12-22 LAB — BASIC METABOLIC PANEL
BUN / CREAT RATIO: 8 — AB (ref 9–20)
BUN: 8 mg/dL (ref 6–20)
CHLORIDE: 98 mmol/L (ref 96–106)
CO2: 24 mmol/L (ref 20–29)
CREATININE: 0.99 mg/dL (ref 0.76–1.27)
Calcium: 9.5 mg/dL (ref 8.7–10.2)
GFR calc Af Amer: 120 mL/min/{1.73_m2} (ref >59)
GFR calc non Af Amer: 104 mL/min/{1.73_m2} (ref >59)
GLUCOSE: 229 mg/dL — AB (ref 65–99)
POTASSIUM: 4.4 mmol/L (ref 3.5–5.2)
SODIUM: 137 mmol/L (ref 134–144)

## 2017-12-22 MED ORDER — VITAMIN D (ERGOCALCIFEROL) 1.25 MG (50000 UNIT) PO CAPS: 50000.0000 [IU] | ORAL_CAPSULE | ORAL | 1 refills | Status: DC

## 2017-12-22 MED ORDER — ASPIRIN EC 81 MG PO TBEC: 81.0000 mg | DELAYED_RELEASE_TABLET | Freq: Every day | ORAL | 1 refills | Status: DC

## 2017-12-22 MED ORDER — CYCLOBENZAPRINE HCL 10 MG PO TABS: 10.0000 mg | ORAL_TABLET | Freq: Every day | ORAL | 0 refills | Status: DC

## 2017-12-22 MED ORDER — MELOXICAM 15 MG PO TABS
15.0000 mg | ORAL_TABLET | Freq: Every day | ORAL | 0 refills | Status: DC
Start: 2017-12-22 — End: 2017-12-30

## 2017-12-22 MED ORDER — VITAMIN B-12 1000 MCG SL SUBL: 1.0000 | SUBLINGUAL_TABLET | Freq: Every day | SUBLINGUAL | 1 refills | Status: DC

## 2017-12-22 MED ORDER — ATORVASTATIN CALCIUM 20 MG PO TABS: 20.0000 mg | ORAL_TABLET | Freq: Every day | ORAL | 1 refills | Status: AC

## 2017-12-22 MED ORDER — SELENIUM SULFIDE 2.25 % EX SHAM: 1.0000 "application " | MEDICATED_SHAMPOO | Freq: Every day | CUTANEOUS | 2 refills | Status: AC | PRN

## 2017-12-22 NOTE — Progress Notes (Addendum)
Subjective:  By signing my name below, I, Zachary Hicks, attest that this documentation has been prepared under the direction and in the presence of Norberto SorensonEva Aleece Loyd, MD Electronically Signed: Charline BillsEssence Hicks, ED Scribe 12/22/2017 at 11:56 AM.   Patient ID: Zachary Hicks, male    DOB: [birth year], 27 y.o.   MRN: [id number]  Chief Complaint  Patient presents with  . medication review    Gabapentin and Lisinopril, also review labs   HPI Zachary Hicks is a 27 y.o. male, with a h/o DM, who presents to Primary Care at [facility] for f/u. Pt does not recall what ophthalmologist he saw last yr. States he never started gabapentin due to fear of side-effects. Denies dizziness, lightheadedness, cough, sob, choking, changes in neuropathy, h/o seizures. He has an upcoming appointment with Dr. Everardo AllEllison.  Low Back Pain Pt reports intermittent low back pain for a while which worsened over the past 2-3 days. States that he was exercising at the gym one day, played soccer the following day and noticed low back pain the following day. No treatments tried PTA. Denies weakness in LE, changes in bowels/bladder.  Past Medical History:  Diagnosis Date  . Diabetes mellitus without complication [facility](HCC)    Current Outpatient Medications on File Prior to Visit  Medication Sig Dispense Refill  . azelastine (ASTELIN) 0.1 % nasal spray Place 2 sprays into both nostrils at bedtime. Use in each nostril as directed 30 mL 5  . gabapentin (NEURONTIN) 300 MG capsule Take 1 capsule (300 mg total) by mouth at bedtime. 90 capsule 1  . Insulin Glargine (BASAGLAR KWIKPEN) 100 UNIT/ML SOPN Inject 0.55 mLs (55 Units total) into the skin at bedtime. 15 mL 4  . Insulin Lispro (HUMALOG KWIKPEN) 200 UNIT/ML SOPN Inject 20 Units into the skin 3 (three) times daily before meals. 30 mL 2  . lisinopril (PRINIVIL,ZESTRIL) 10 MG tablet Take 1 tablet (10 mg total) by mouth daily. 30 tablet 1  . aspirin EC 81 MG tablet Take 1 tablet (81 mg total) by  mouth daily. (Patient not taking: Reported on 12/22/2017)    . hydrocortisone 2.5 % ointment Apply topically 2 (two) times daily. (Patient not taking: Reported on 12/22/2017) 30 g 0  . Insulin Aspart (NOVOLOG FLEXPEN [place]) Inject into the skin.    Marland Kitchen. insulin detemir (LEVEMIR) 100 UNIT/ML injection Inject into the skin at bedtime.     No current facility-administered medications on file prior to visit.    No Known Allergies   No past surgical history on file. No family history on file. Social History   Socioeconomic History  . Marital status: Single    Spouse name: None  . Number of children: None  . Years of education: None  . Highest education level: None  Social Needs  . Financial resource strain: None  . Food insecurity - worry: None  . Food insecurity - inability: None  . Transportation needs - medical: None  . Transportation needs - non-medical: None  Occupational History  . None  Tobacco Use  . Smoking status: Never Smoker  . Smokeless tobacco: Never Used  Substance and Sexual Activity  . Alcohol use: No  . Drug use: No  . Sexual activity: None  Other Topics Concern  . None  Social History Narrative  . None   [facility] 12/22/2017  Decreased Interest 0  Down, Depressed, Hopeless 0  PHQ - 2 Score 0     Review of Systems  Respiratory: Negative for cough,  choking and shortness of breath.   Gastrointestinal: Negative for constipation and diarrhea.  Genitourinary: Negative for enuresis.  Musculoskeletal: Positive for back pain.  Neurological: Negative for dizziness, seizures, weakness and light-headedness.      Objective:   Physical Exam  Constitutional: He is oriented to person, place, and time. He appears well-developed and well-nourished. No distress.  HENT:  Head: Normocephalic and atraumatic.  Eyes: Conjunctivae and EOM are normal.  Neck: Neck supple. No tracheal deviation present.  Cardiovascular: Normal rate.  Pulmonary/Chest: Effort  normal. No respiratory distress.  Musculoskeletal: Normal range of motion.  Low lumbar: No midline tenderness. No point tenderness over paraspinal. Neg straight leg raise.  L knee: crepitus  Neurological: He is alert and oriented to person, place, and time. He has normal reflexes.  Skin: Skin is warm and dry.  Psychiatric: He has a normal mood and affect. His behavior is normal.  Nursing note and vitals reviewed.  BP 110/82 (BP Location: Left Arm, Patient Position: Sitting, Cuff Size: Large)   Pulse 92   Temp 98.1 F (36.7 C) (Oral)   Resp 16   Ht 5' 6.5" (1.689 m)   Wt 213 lb 9.6 oz (96.9 kg)   SpO2 98%   BMI 33.96 kg/m     Assessment & Plan:   1. Vitamin D deficiency - start otc qd replacement  2. Medication monitoring encounter   3. Type 1 diabetes mellitus with diabetic polyneuropathy (HCC) -- has appt w/ endocrine to discuss insulin pump. Freestyle libre meter working GREAT - loves it.  4. Elevated blood pressure reading - check bmp since started lisinopril 10 last mo for renal protection  5. Polyneuropathy associated with underlying disease (HCC) - reviewed side effects & gabapentin risks again - start trial qhs after no longer taking cyclobenzaprine qhs  6. Hyperlipidemia LDL goal <100 - start atorvastatin  7. Low vitamin B12 level - start sl qd supp  8. Acute myofascial strain of lumbar region, initial encounter - gave note for school parking for sev wks. Start meloxicam qam and flexeril qhs x 2-4 wks, heat, gentle stretching. Home exercises given    Orders Placed This Encounter  Procedures  . Basic metabolic panel    Order Specific Question:   Has the patient fasted?    Answer:   No  . Ambulatory referral to Ophthalmology    Referral Priority:   Routine    Referral Type:   Consultation    Referral Reason:   Specialty Services Required    Requested Specialty:   Ophthalmology    Number of Visits Requested:   1    Meds ordered this encounter  Medications  .  Selenium Sulfide 2.25 % SHAM    Sig: Apply 1 application topically daily as needed.    Dispense:  180 mL    Refill:  2  . Vitamin D, Ergocalciferol, (DRISDOL) 50000 units CAPS capsule    Sig: Take 1 capsule (50,000 Units total) by mouth every 7 (seven) days.    Dispense:  12 capsule    Refill:  1  . atorvastatin (LIPITOR) 20 MG tablet    Sig: Take 1 tablet (20 mg total) by mouth daily.    Dispense:  90 tablet    Refill:  1  . meloxicam (MOBIC) 15 MG tablet    Sig: Take 1 tablet (15 mg total) by mouth daily.    Dispense:  30 tablet    Refill:  0  . cyclobenzaprine (FLEXERIL) 10 MG tablet  Sig: Take 1 tablet (10 mg total) by mouth at bedtime.    Dispense:  30 tablet    Refill:  0  . Cyanocobalamin (VITAMIN B-12) 1000 MCG SUBL    Sig: Place 1 tablet (1,000 mcg total) under the tongue daily.    Dispense:  180 tablet    Refill:  1  . aspirin EC 81 MG tablet    Sig: Take 1 tablet (81 mg total) by mouth daily.    Dispense:  360 tablet    Refill:  1    I personally performed the services described in this documentation, which was scribed in my presence. The recorded information has been reviewed and considered, and addended by me as needed.   Norberto Sorenson, M.D.  Primary Care at [facility] [address] [place], [place] [address] [phone] phone [phone] fax  12/24/17 1:32 AM

## 2017-12-22 NOTE — Patient Instructions (Addendum)
Start aspirin EC 81 mg daily.    IF you received an x-ray today, you will receive an invoice from [facility]. Please contact [facility] at [phone] with questions or concerns regarding your invoice.   IF you received labwork today, you will receive an invoice from [employer]. Please contact [facility] at [phone] with questions or concerns regarding your invoice.   Our billing staff will not be able to assist you with questions regarding bills from these companies.  You will be contacted with the lab results as soon as they are available. The fastest way to get your results is to activate your My Chart account. Instructions are located on the last page of this paperwork. If you have not heard from [place] regarding the results in 2 weeks, please contact this office.     Low Back Sprain Rehab Ask your health care provider which exercises are safe for you. Do exercises exactly as told by your health care provider and adjust them as directed. It is normal to feel mild stretching, pulling, tightness, or discomfort as you do these exercises, but you should stop right away if you feel sudden pain or your pain gets worse. Do not begin these exercises until told by your health care provider. Stretching and range of motion exercises These exercises warm up your muscles and joints and improve the movement and flexibility of your back. These exercises also help to relieve pain, numbness, and tingling. Exercise A: Lumbar rotation  1. Lie on your back on a firm surface and bend your knees. 2. Straighten your arms out to your sides so each arm forms an "L" shape with a side of your body (a 90 degree angle). 3. Slowly move both of your knees to one side of your body until you feel a stretch in your lower back. Try not to let your shoulders move off of the floor. 4. Hold for __________ seconds. 5. Tense your abdominal muscles and slowly move your knees back to the starting  position. 6. Repeat this exercise on the other side of your body. Repeat __________ times. Complete this exercise __________ times a day. Exercise B: Prone extension on elbows  1. Lie on your abdomen on a firm surface. 2. Prop yourself up on your elbows. 3. Use your arms to help lift your chest up until you feel a gentle stretch in your abdomen and your lower back. ? This will place some of your body weight on your elbows. If this is uncomfortable, try stacking pillows under your chest. ? Your hips should stay down, against the surface that you are lying on. Keep your hip and back muscles relaxed. 4. Hold for __________ seconds. 5. Slowly relax your upper body and return to the starting position. Repeat __________ times. Complete this exercise __________ times a day. Strengthening exercises These exercises build strength and endurance in your back. Endurance is the ability to use your muscles for a long time, even after they get tired. Exercise C: Pelvic tilt 1. Lie on your back on a firm surface. Bend your knees and keep your feet flat. 2. Tense your abdominal muscles. Tip your pelvis up toward the ceiling and flatten your lower back into the floor. ? To help with this exercise, you may place a small towel under your lower back and try to push your back into the towel. 3. Hold for __________ seconds. 4. Let your muscles relax completely before you repeat this exercise. Repeat __________ times. Complete this exercise __________ times  a day. Exercise D: Alternating arm and leg raises  1. Get on your hands and knees on a firm surface. If you are on a hard floor, you may want to use padding to cushion your knees, such as an exercise mat. 2. Line up your arms and legs. Your hands should be below your shoulders, and your knees should be below your hips. 3. Lift your left leg behind you. At the same time, raise your right arm and straighten it in front of you. ? Do not lift your leg higher than  your hip. ? Do not lift your arm higher than your shoulder. ? Keep your abdominal and back muscles tight. ? Keep your hips facing the ground. ? Do not arch your back. ? Keep your balance carefully, and do not hold your breath. 4. Hold for __________ seconds. 5. Slowly return to the starting position and repeat with your right leg and your left arm. Repeat __________ times. Complete this exercise __________ times a day. Exercise E: Abdominal set with straight leg raise  1. Lie on your back on a firm surface. 2. Bend one of your knees and keep your other leg straight. 3. Tense your abdominal muscles and lift your straight leg up, 4-6 inches (10-15 cm) off the ground. 4. Keep your abdominal muscles tight and hold for __________ seconds. ? Do not hold your breath. ? Do not arch your back. Keep it flat against the ground. 5. Keep your abdominal muscles tense as you slowly lower your leg back to the starting position. 6. Repeat with your other leg. Repeat __________ times. Complete this exercise __________ times a day. Posture and body mechanics  Body mechanics refers to the movements and positions of your body while you do your daily activities. Posture is part of body mechanics. Good posture and healthy body mechanics can help to relieve stress in your body's tissues and joints. Good posture means that your spine is in its natural S-curve position (your spine is neutral), your shoulders are pulled back slightly, and your head is not tipped forward. The following are general guidelines for applying improved posture and body mechanics to your everyday activities. Standing   When standing, keep your spine neutral and your feet about hip-width apart. Keep a slight bend in your knees. Your ears, shoulders, and hips should line up.  When you do a task in which you stand in one place for a long time, place one foot up on a stable object that is 2-4 inches (5-10 cm) high, such as a footstool. This  helps keep your spine neutral. Sitting   When sitting, keep your spine neutral and keep your feet flat on the floor. Use a footrest, if necessary, and keep your thighs parallel to the floor. Avoid rounding your shoulders, and avoid tilting your head forward.  When working at a desk or a computer, keep your desk at a height where your hands are slightly lower than your elbows. Slide your chair under your desk so you are close enough to maintain good posture.  When working at a computer, place your monitor at a height where you are looking straight ahead and you do not have to tilt your head forward or downward to look at the screen. Resting   When lying down and resting, avoid positions that are most painful for you.  If you have pain with activities such as sitting, bending, stooping, or squatting (flexion-based activities), lie in a position in which your body does  not bend very much. For example, avoid curling up on your side with your arms and knees near your chest (fetal position).  If you have pain with activities such as standing for a long time or reaching with your arms (extension-based activities), lie with your spine in a neutral position and bend your knees slightly. Try the following positions:  Lying on your side with a pillow between your knees.  Lying on your back with a pillow under your knees. Lifting   When lifting objects, keep your feet at least shoulder-width apart and tighten your abdominal muscles.  Bend your knees and hips and keep your spine neutral. It is important to lift using the strength of your legs, not your back. Do not lock your knees straight out.  Always ask for help to lift heavy or awkward objects. This information is not intended to replace advice given to you by your health care provider. Make sure you discuss any questions you have with your health care provider. Document Released: 10/18/2005 Document Revised: 06/24/2016 Document Reviewed:  07/30/2015 Elsevier Interactive Patient Education  [employer]2018 [employer].

## 2017-12-30 ENCOUNTER — Encounter: Payer: Self-pay | Admitting: Endocrinology

## 2017-12-30 ENCOUNTER — Ambulatory Visit (INDEPENDENT_AMBULATORY_CARE_PROVIDER_SITE_OTHER): Payer: PPO | Admitting: Endocrinology

## 2017-12-30 VITALS — BP 128/90 | HR 109 | Ht 66.5 in | Wt 218.0 lb

## 2017-12-30 DIAGNOSIS — E1042 Type 1 diabetes mellitus with diabetic polyneuropathy: Secondary | ICD-10-CM | POA: Diagnosis not present

## 2017-12-30 MED ORDER — FREESTYLE LIBRE 14 DAY SENSOR MISC: 1.0000 | 3 refills | Status: DC

## 2017-12-30 MED ORDER — INSULIN LISPRO 200 UNIT/ML ~~LOC~~ SOPN: 10.0000 [IU] | PEN_INJECTOR | Freq: Three times a day (TID) | SUBCUTANEOUS | 2 refills | Status: DC

## 2017-12-30 NOTE — Patient Instructions (Addendum)
good diet and exercise significantly improve the control of your diabetes.  please let me know if you wish to be referred to a dietician.  high blood sugar is very risky to your health.  you should see an eye doctor and dentist every year.  It is very important to get all recommended vaccinations.  Controlling your blood pressure and cholesterol drastically reduces the damage diabetes does to your body.  Those who smoke should quit.  Please discuss these with your doctor.  check your blood sugar 4 times a day: before the 3 meals, and at bedtime.  also check if you have symptoms of your blood sugar being too high or too low.  please keep a record of the readings and bring it to your next appointment here (or you can bring the meter itself).  You can write it on any piece of paper.  please call us sooner if your blood sugar goes below 70, or if you have a lot of readings over 200. For now, please: Please continue the same basaglar, and: Change the humalog to 10-15 units with breakfast; and 25-30 units with lunch and supper. Please see Zachary Hicks, to consider a pump. I have sent a prescription to your pharmacy, to refill the continuous glucose monitor Please come back for a follow-up appointment in 2-3 weeks.

## 2017-12-30 NOTE — Progress Notes (Signed)
Subjective:    Patient ID: Zachary Hicks, male    DOB: [birth year], [age] y.o.   MRN: [id number]  HPI pt is referred by Dr Clelia CroftShaw, for diabetes.  Pt states DM was dx'ed in 2009; he has mild neuropathy of the lower extremities; he is unaware of any associated chronic complications; he has been on insulin since dx; pt says his diet and exercise are good; he has never had pancreatitis, pancreatic surgery, severe hypoglycemia or DKA.  He takes multiple daily injections.  He says cbg's vary from 200-300.  It is lowest after breakfast.  In 2 months, he will graduate and return to [place].   Past Medical History:  Diagnosis Date  . Diabetes mellitus without complication (HCC)     History reviewed. No pertinent surgical history.  Social History   Socioeconomic History  . Marital status: Married    Spouse name: Not on file  . Number of children: Not on file  . Years of education: Not on file  . Highest education level: Not on file  Social Needs  . Financial resource strain: Not on file  . Food insecurity - worry: Not on file  . Food insecurity - inability: Not on file  . Transportation needs - medical: Not on file  . Transportation needs - non-medical: Not on file  Occupational History  . Not on file  Tobacco Use  . Smoking status: Never Smoker  . Smokeless tobacco: Never Used  Substance and Sexual Activity  . Alcohol use: No  . Drug use: No  . Sexual activity: Not on file  Other Topics Concern  . Not on file  Social History Narrative  . Not on file    Current Outpatient Medications on File Prior to Visit  Medication Sig Dispense Refill  . atorvastatin (LIPITOR) 20 MG tablet Take 1 tablet (20 mg total) by mouth daily. 90 tablet 1  . azelastine (ASTELIN) 0.1 % nasal spray Place 2 sprays into both nostrils at bedtime. Use in each nostril as directed 30 mL 5  . Cyanocobalamin (VITAMIN B-12) 1000 MCG SUBL Place 1 tablet (1,000 mcg total) under the tongue daily. 180 tablet 1  .  cyclobenzaprine (FLEXERIL) 10 MG tablet Take 1 tablet (10 mg total) by mouth at bedtime. 30 tablet 0  . hydrocortisone 2.5 % ointment Apply topically 2 (two) times daily. 30 g 0  . Insulin Glargine (BASAGLAR KWIKPEN) 100 UNIT/ML SOPN Inject 0.55 mLs (55 Units total) into the skin at bedtime. 15 mL 4  . lisinopril (PRINIVIL,ZESTRIL) 10 MG tablet Take 1 tablet (10 mg total) by mouth daily. 30 tablet 1  . Selenium Sulfide 2.25 % SHAM Apply 1 application topically daily as needed. 180 mL 2  . Vitamin D, Ergocalciferol, (DRISDOL) 50000 units CAPS capsule Take 1 capsule (50,000 Units total) by mouth every 7 (seven) days. 12 capsule 1   No current facility-administered medications on file prior to visit.     No Known Allergies  Family History  Problem Relation Age of Onset  . Diabetes Neg Hx     BP 128/90 (BP Location: Left Arm, Patient Position: Sitting, Cuff Size: Large)   Pulse (!) 109   Ht 5' 6.5" (1.689 m)   Wt 218 lb (98.9 kg)   SpO2 98%   BMI 34.66 kg/m     Review of Systems denies blurry vision, headache, chest pain, sob, n/v, urinary frequency, muscle cramps, excessive diaphoresis, depression, cold intolerance, rhinorrhea, and easy bruising.  He has low-back  pain and weight gain.      Objective:   Physical Exam VS: see vs page GEN: no distress HEAD: head: no deformity eyes: no periorbital swelling, no proptosis external nose and ears are normal mouth: no lesion seen NECK: supple, thyroid is not enlarged CHEST WALL: no deformity LUNGS: clear to auscultation CV: reg rate and rhythm, no murmur ABD: abdomen is soft, nontender.  no hepatosplenomegaly.  not distended.  no hernia MUSCULOSKELETAL: muscle bulk and strength are grossly normal.  no obvious joint swelling.  gait is normal and steady EXTEMITIES: no deformity.  no ulcer on the feet.  feet are of normal color and temp.  no edema PULSES: dorsalis pedis intact bilat.  no carotid bruit NEURO:  cn 2-12 grossly intact.    readily moves all 4's.  sensation is intact to touch on the feet SKIN:  Normal texture and temperature.  No rash or suspicious lesion is visible.   NODES:  None palpable at the neck PSYCH: alert, well-oriented.  Does not appear anxious nor depressed.   Lab Results  Component Value Date   CREATININE 0.99 12/22/2017   BUN 8 12/22/2017   NA 137 12/22/2017   K 4.4 12/22/2017   CL 98 12/22/2017   CO2 24 12/22/2017   Lab Results  Component Value Date   HGBA1C 8.6 11/23/2017   I have reviewed outside records, and summarized: Pt was noted to have elevated a1c, and referred here.  Several other probs were addressed, including vit-D def, back pain, dyslipidemia, and B-12 def      Assessment & Plan:  Type 1 DM: he needs increased rx   Patient Instructions  good diet and exercise significantly improve the control of your diabetes.  please let me know if you wish to be referred to a dietician.  high blood sugar is very risky to your health.  you should see an eye doctor and dentist every year.  It is very important to get all recommended vaccinations.  Controlling your blood pressure and cholesterol drastically reduces the damage diabetes does to your body.  Those who smoke should quit.  Please discuss these with your doctor.  check your blood sugar 4 times a day: before the 3 meals, and at bedtime.  also check if you have symptoms of your blood sugar being too high or too low.  please keep a record of the readings and bring it to your next appointment here (or you can bring the meter itself).  You can write it on any piece of paper.  please call us sooner if your blood sugar goes below 70, or if you have a lot of readings over 200. For now, please: Please continue the same basaglar, and: Change the humalog to 10-15 units with breakfast; and 25-30 units with lunch and supper. Please see Bonita Quin, to consider a pump. I have sent a prescription to your pharmacy, to refill the continuous glucose  monitor Please come back for a follow-up appointment in 2-3 weeks.

## 2018-01-02 ENCOUNTER — Telehealth: Payer: Self-pay | Admitting: Endocrinology

## 2018-01-02 NOTE — Telephone Encounter (Signed)
[facility] was [facility] from 100 units to 200 units of Humalog. Please call ph# [phone] to confirm

## 2018-01-03 ENCOUNTER — Other Ambulatory Visit: Payer: Self-pay | Admitting: Endocrinology

## 2018-01-03 ENCOUNTER — Other Ambulatory Visit: Payer: Self-pay

## 2018-01-03 MED ORDER — INSULIN LISPRO 100 UNIT/ML (KWIKPEN): PEN_INJECTOR | SUBCUTANEOUS | 11 refills | Status: DC

## 2018-01-03 NOTE — Telephone Encounter (Signed)
[facility] called and I see there are two sets of directions for Humalog. Which are correct?

## 2018-01-03 NOTE — Telephone Encounter (Signed)
Epic would not accept the instructions for 200 units/ml, so I resent for 100 units/ml

## 2018-01-09 ENCOUNTER — Encounter: Payer: PPO | Admitting: Nutrition

## 2018-01-11 ENCOUNTER — Encounter: Payer: PPO | Attending: Endocrinology | Admitting: Nutrition

## 2018-01-11 DIAGNOSIS — G63 Polyneuropathy in diseases classified elsewhere: Secondary | ICD-10-CM

## 2018-01-11 DIAGNOSIS — Z713 Dietary counseling and surveillance: Secondary | ICD-10-CM | POA: Insufficient documentation

## 2018-01-11 DIAGNOSIS — E1042 Type 1 diabetes mellitus with diabetic polyneuropathy: Secondary | ICD-10-CM | POA: Insufficient documentation

## 2018-01-12 NOTE — Progress Notes (Signed)
We discussed the advantages and disadvantages of insulin pump therapy.  We discussed the need to test blood sugars 4X/day and the other things that are needed to be on pump therapy.  He agreed to those terms.   He was shown the 3 different pumps and given brochures of each model.  We discussed the advantages/disadvantages of each model, and he appears to be favoring the OmniPod. He was told to go to each pump's web site, and to do research from chat rooms as well as their official sites, and to fill out paperwork in the back of each pamphlet and return to me to fax, once he has made up his mind on what he wants. He had no final questions.

## 2018-01-13 NOTE — Patient Instructions (Signed)
Read over pamphlets and go to their respective web sites for more information on each pump Once you decide on which pump you want, fill out paperwork with pamphet, and fax or drop off at the office.

## 2018-01-16 ENCOUNTER — Encounter: Payer: Self-pay | Admitting: Endocrinology

## 2018-01-16 ENCOUNTER — Ambulatory Visit (INDEPENDENT_AMBULATORY_CARE_PROVIDER_SITE_OTHER): Payer: PPO | Admitting: Endocrinology

## 2018-01-16 VITALS — BP 124/87 | HR 101 | Ht 66.9 in | Wt 214.0 lb

## 2018-01-16 DIAGNOSIS — E559 Vitamin D deficiency, unspecified: Secondary | ICD-10-CM | POA: Diagnosis not present

## 2018-01-16 DIAGNOSIS — E1042 Type 1 diabetes mellitus with diabetic polyneuropathy: Secondary | ICD-10-CM

## 2018-01-16 LAB — HEMOGLOBIN A1C: Hgb A1c MFr Bld: 7.5 % — ABNORMAL HIGH (ref 4.6–6.5)

## 2018-01-16 NOTE — Progress Notes (Signed)
Subjective:    Patient ID: Zachary Hicks, male    DOB: 18-Jun-1991, 27 y.o.   MRN: [id number]  HPI Pt returns for f/u of diabetes mellitus: DM type: 1 Dx'ed: 2009 Complications: polyneuropathy Therapy: insulin since dx DKA: never Severe hypoglycemia: never Pancreatitis: never Other: he will soon return to [place]. Interval history: Pt says for the past 2 weeks, he is taking ergocalciferol 50,000 units qd, rather than q week.  He has fatigue.   Past Medical History:  Diagnosis Date  . Diabetes mellitus without complication (HCC)     No past surgical history on file.  Social History   Socioeconomic History  . Marital status: Married    Spouse name: Not on file  . Number of children: Not on file  . Years of education: Not on file  . Highest education level: Not on file  Social Needs  . Financial resource strain: Not on file  . Food insecurity - worry: Not on file  . Food insecurity - inability: Not on file  . Transportation needs - medical: Not on file  . Transportation needs - non-medical: Not on file  Occupational History  . Not on file  Tobacco Use  . Smoking status: Never Smoker  . Smokeless tobacco: Never Used  Substance and Sexual Activity  . Alcohol use: No  . Drug use: No  . Sexual activity: Not on file  Other Topics Concern  . Not on file  Social History Narrative  . Not on file    Current Outpatient Medications on File Prior to Visit  Medication Sig Dispense Refill  . atorvastatin (LIPITOR) 20 MG tablet Take 1 tablet (20 mg total) by mouth daily. 90 tablet 1  . azelastine (ASTELIN) 0.1 % nasal spray Place 2 sprays into both nostrils at bedtime. Use in each nostril as directed 30 mL 5  . Continuous Blood Gluc Sensor (FREESTYLE LIBRE 14 DAY SENSOR) MISC 1 Device by Does not apply route every 14 (fourteen) days. 6 each 3  . Cyanocobalamin (VITAMIN B-12) 1000 MCG SUBL Place 1 tablet (1,000 mcg total) under the tongue daily. 180 tablet 1  .  cyclobenzaprine (FLEXERIL) 10 MG tablet Take 1 tablet (10 mg total) by mouth at bedtime. 30 tablet 0  . hydrocortisone 2.5 % ointment Apply topically 2 (two) times daily. 30 g 0  . Insulin Glargine (BASAGLAR KWIKPEN) 100 UNIT/ML SOPN Inject 0.55 mLs (55 Units total) into the skin at bedtime. 15 mL 4  . insulin lispro (HUMALOG KWIKPEN) 100 UNIT/ML KiwkPen 10-15 with breakfast; and 25-30 with lunch and supper, and pen needles 3/day 15 pen 11  . lisinopril (PRINIVIL,ZESTRIL) 10 MG tablet Take 1 tablet (10 mg total) by mouth daily. 30 tablet 1  . Selenium Sulfide 2.25 % SHAM Apply 1 application topically daily as needed. 180 mL 2  . Vitamin D, Ergocalciferol, (DRISDOL) 50000 units CAPS capsule Take 1 capsule (50,000 Units total) by mouth every 7 (seven) days. 12 capsule 1   No current facility-administered medications on file prior to visit.     No Known Allergies  Family History  Problem Relation Age of Onset  . Diabetes Neg Hx     BP 124/87   Pulse (!) 101   Ht 5' 6.9" (1.699 m)   Wt 214 lb (97.1 kg)   SpO2 97%   BMI 33.62 kg/m    Review of Systems Denies visual loss polyuria, hematuria, hypoglycemia, and numbness    Objective:   Physical Exam VITAL  SIGNS:  See vs page.  GENERAL: no distress Chest wall: no kyphosis Gait: normal and steady.       Assessment & Plan:  accidental overdosage of ergocalciferol, new.  Check level today Type 1 DM: When he goes on pump, he should start with these settings:  basal rate of 1.8 units/hr.  bolus of 1 unit/10 grams carbohydrate continue correction bolus (which some people call "sensitivity," or "insulin sensitivity ratio," or just "isr") of 1 unit for each 25 by which glucose exceeds 100

## 2018-01-16 NOTE — Patient Instructions (Addendum)
blood tests are requested for you today.  We'll let you know about the results.  Please come back a few days after starting the pump.

## 2018-01-17 LAB — PTH, INTACT AND CALCIUM
Calcium: 9.7 mg/dL (ref 8.6–10.3)
PTH: 42 pg/mL (ref 14–64)

## 2018-01-17 LAB — VITAMIN D 25 HYDROXY (VIT D DEFICIENCY, FRACTURES): VITD: 50.84 ng/mL (ref 30.00–100.00)

## 2018-01-18 ENCOUNTER — Other Ambulatory Visit: Payer: Self-pay | Admitting: Family Medicine

## 2018-01-18 NOTE — Telephone Encounter (Signed)
Lisinopril refill Last OV: 11/23/17 Last Refill:11/23/17 #30 1 RF [facility] [address] PCP: Zachary SorensonEva Shaw MD

## 2018-01-19 ENCOUNTER — Other Ambulatory Visit: Payer: Self-pay | Admitting: Family Medicine

## 2018-02-06 ENCOUNTER — Telehealth: Payer: Self-pay

## 2018-02-06 ENCOUNTER — Encounter: Payer: PPO | Attending: Endocrinology | Admitting: Nutrition

## 2018-02-06 DIAGNOSIS — E1042 Type 1 diabetes mellitus with diabetic polyneuropathy: Secondary | ICD-10-CM | POA: Insufficient documentation

## 2018-02-06 DIAGNOSIS — Z713 Dietary counseling and surveillance: Secondary | ICD-10-CM | POA: Insufficient documentation

## 2018-02-06 NOTE — Telephone Encounter (Signed)
Phone call to patient. He states that his blood pressure has been elevated for the last week.   He states his blood pressure today was 137/90, last few days BP has been 140's over 90's. He states he doesn't take this at a certain time of day, [facility]   Denies chest pain, shortness of breath, visual changes, palpitations. States he has a headache, describes as a "heavy" feeling. Headache extends to his back. Laying down helps him feel better.   He states he is taking Lisinopril 10mg  at night. When he takes this during the daytime it makes him dizzy and sleepy.   He states he is taking B12 and calcium, wants to know if this could be related.   Patient states he was informed by his eye doctor he had "high pressure" in his eyes.   Patient advised to come in for office visit to discuss blood pressure concerns, he is agreeable. Transferred to front desk, scheduled 02/09/2018.   Provider, Lorain ChildesFYI.

## 2018-02-06 NOTE — Telephone Encounter (Signed)
Copied from CRM [id number]. Topic: General - Other >> Feb 06, 2018 12:31 PM Percival SpanishKennedy, Cheryl W wrote:  Pt call to say that his bp is still high but he did not have any numbers to really give    would like a call back    [phone]

## 2018-02-07 ENCOUNTER — Encounter: Payer: PPO | Admitting: Nutrition

## 2018-02-07 ENCOUNTER — Telehealth: Payer: Self-pay | Admitting: Nutrition

## 2018-02-07 DIAGNOSIS — E1042 Type 1 diabetes mellitus with diabetic polyneuropathy: Secondary | ICD-10-CM

## 2018-02-07 NOTE — Telephone Encounter (Signed)
Perfect. Thanks.

## 2018-02-07 NOTE — Telephone Encounter (Signed)
Patient's blood sugar dropped from 110 at 2AM to 59 at 6AM.  Basal rate is 1.8uhr.

## 2018-02-07 NOTE — Telephone Encounter (Signed)
Please reduce basal to 1.6 units/hr

## 2018-02-08 ENCOUNTER — Encounter: Payer: PPO | Admitting: Nutrition

## 2018-02-08 DIAGNOSIS — E1042 Type 1 diabetes mellitus with diabetic polyneuropathy: Secondary | ICD-10-CM

## 2018-02-08 NOTE — Telephone Encounter (Signed)
This was done yesterday per verbal order reverbalized at [facility]3PM

## 2018-02-08 NOTE — Progress Notes (Signed)
Pt. Reported no difficulty yesterday in giving the bolus.  His blood sugar dropped low at 6AM.  Pump was downloaded and Dr. Everardo AllEllison was notified.  He was shown how to change the basal rated settings and he reduced this setting to 1.6u/hr per Dr. George HughEllison's orders.   We reviewed temp basal rates--how/when to use them, how to put the pump in stop,sick day guidelines, and alerts and alarms.   We will do a pod change tomorrow. He reported good understanding of all topics covered, and had no final questions

## 2018-02-08 NOTE — Progress Notes (Signed)
Pt. Was trained on how to fill, apply and use the OmniPod insulin pump.  Settings were put into the pump per Dr. George HughEllison's order:  Basal rate: 1.8,  I/C ratio: 10,  ISF: 25, Timing: 4 hours, target: 100 with correction over 100. He filled a pod with insulin, and attached it to his outer right arm with some assistance from me.   He says he needs help with carb counting.  We reviewed this some, and he was given a list of 15 gram carbs and told to download Calorie Brooke D[employer]King on his phone.  He still wants to see the dietitian, before going back to [place].   He redemonstrated how to give a bolus X2 correctly, and had no final questions. He was encouraged to set up his [employer] account, and given a sheet on how to do this.  He was also given a resource manual with directions for how to fill a pod, change settings,and other pump information.   He had no final questions.

## 2018-02-08 NOTE — Patient Instructions (Signed)
Test blood sugar before meals and at bedtime.  Do no use Libre readings. Call 800 help line if questions about pump. Call office if blood sugars drop low, or remain over 200

## 2018-02-08 NOTE — Patient Instructions (Signed)
Test blood sugar before meals and at bedtime. 

## 2018-02-09 ENCOUNTER — Other Ambulatory Visit: Payer: Self-pay | Admitting: Endocrinology

## 2018-02-09 ENCOUNTER — Ambulatory Visit: Payer: PPO | Admitting: Family Medicine

## 2018-02-09 DIAGNOSIS — E1042 Type 1 diabetes mellitus with diabetic polyneuropathy: Secondary | ICD-10-CM

## 2018-02-10 ENCOUNTER — Telehealth: Payer: Self-pay | Admitting: Dietician

## 2018-02-10 ENCOUNTER — Encounter: Payer: Self-pay | Admitting: Dietician

## 2018-02-10 ENCOUNTER — Ambulatory Visit (INDEPENDENT_AMBULATORY_CARE_PROVIDER_SITE_OTHER): Payer: PPO | Admitting: Endocrinology

## 2018-02-10 ENCOUNTER — Encounter: Payer: PPO | Admitting: Dietician

## 2018-02-10 ENCOUNTER — Encounter: Payer: Self-pay | Admitting: Endocrinology

## 2018-02-10 VITALS — BP 128/90 | HR 94 | Wt 217.2 lb

## 2018-02-10 DIAGNOSIS — E1042 Type 1 diabetes mellitus with diabetic polyneuropathy: Secondary | ICD-10-CM

## 2018-02-10 DIAGNOSIS — Z713 Dietary counseling and surveillance: Secondary | ICD-10-CM | POA: Diagnosis not present

## 2018-02-10 NOTE — Patient Instructions (Signed)
Continue to practice carbohydrate counting. Begin paying attention to when you are full to avoid overeating.  Eat slowly away from distraction.  Enjoy your food when you are eating. Choose baked or grilled more than fried more often.  Resources:  Calorie king.com  google the food  google [employer]he restaurant name and nutrition  See app list.

## 2018-02-10 NOTE — Patient Instructions (Addendum)
Please take these pump settings: basal rate of 1.6 units/hr. bolus of 1 unit/9 grams carbohydrate.  continue correction bolus (which some people call "sensitivity," or "insulin sensitivity ratio," or just "isr") of 1 unit for each 25 by which your glucose exceeds 100.  Please come back for a follow-up appointment in 3 weeks.

## 2018-02-10 NOTE — Progress Notes (Signed)
Subjective:    Patient ID: Zachary Hicks, male    DOB: [birth year], 27 y.o.   MRN: [id number]  HPI Pt returns for f/u of diabetes mellitus: DM type: 1 Dx'ed: 2009 Complications: polyneuropathy Therapy: insulin since dx DKA: never Severe hypoglycemia: never Pancreatitis: never Other: he will return to [place] in mid 5/19. Interval history: TDD is 57 units.  He takes these settings: basal rate of 1.6 units/hr. bolus of 1 unit/10 grams carbohydrate continue correction bolus (which some people call "sensitivity," or "insulin sensitivity ratio," or just "isr") of 1 unit for each 25 by which your glucose exceeds 100 We have dodmloaded continuous glucose monitor data, and we have reviewed together.  It varies from 80-300.  It is in general higher as the day goes on, but not necessarily so. Ramadan month starts 03/05/18.   Past Medical History:  Diagnosis Date  . Diabetes mellitus without complication (HCC)     History reviewed. No pertinent surgical history.  Social History   Socioeconomic History  . Marital status: Married    Spouse name: Not on file  . Number of children: Not on file  . Years of education: Not on file  . Highest education level: Not on file  Occupational History  . Not on file  Social Needs  . Financial resource strain: Not on file  . Food insecurity:    Worry: Not on file    Inability: Not on file  . Transportation needs:    Medical: Not on file    Non-medical: Not on file  Tobacco Use  . Smoking status: Never Smoker  . Smokeless tobacco: Never Used  Substance and Sexual Activity  . Alcohol use: No  . Drug use: No  . Sexual activity: Not on file  Lifestyle  . Physical activity:    Days per week: Not on file    Minutes per session: Not on file  . Stress: Not on file  Relationships  . Social connections:    Talks on phone: Not on file    Gets together: Not on file    Attends religious service: Not on file    Active member of club or  organization: Not on file    Attends meetings of clubs or organizations: Not on file    Relationship status: Not on file  . Intimate partner violence:    Fear of current or ex partner: Not on file    Emotionally abused: Not on file    Physically abused: Not on file    Forced sexual activity: Not on file  Other Topics Concern  . Not on file  Social History Narrative  . Not on file    Current Outpatient Medications on File Prior to Visit  Medication Sig Dispense Refill  . atorvastatin (LIPITOR) 20 MG tablet Take 1 tablet (20 mg total) by mouth daily. 90 tablet 1  . Continuous Blood Gluc Sensor (FREESTYLE LIBRE 14 DAY SENSOR) MISC 1 Device by Does not apply route every 14 (fourteen) days. 6 each 3  . Selenium Sulfide 2.25 % SHAM Apply 1 application topically daily as needed. 180 mL 2   No current facility-administered medications on file prior to visit.     No Known Allergies  Family History  Problem Relation Age of Onset  . Diabetes Neg Hx     BP 128/90 (BP Location: Left Arm, Patient Position: Sitting, Cuff Size: Normal)   Pulse 94   Wt 217 lb 3.2 oz (98.5 kg)  SpO2 98%   BMI 34.12 kg/m    Review of Systems He denies hypoglycemia    Objective:   Physical Exam VITAL SIGNS:  See vs page GENERAL: no distress Pulses: dorsalis pedis intact bilat.   MSK: no deformity of the feet CV: no leg edema Skin:  no ulcer on the feet.  normal color and temp on the feet. Neuro: sensation is intact to touch on the feet      Assessment & Plan:  Type 1 DM: he needs increased rx.   Patient Instructions  Please take these pump settings: basal rate of 1.6 units/hr. bolus of 1 unit/9 grams carbohydrate.  continue correction bolus (which some people call "sensitivity," or "insulin sensitivity ratio," or just "isr") of 1 unit for each 25 by which your glucose exceeds 100.  Please come back for a follow-up appointment in 3 weeks.

## 2018-02-11 ENCOUNTER — Other Ambulatory Visit: Payer: PPO

## 2018-02-11 ENCOUNTER — Other Ambulatory Visit: Payer: Self-pay

## 2018-02-11 ENCOUNTER — Ambulatory Visit (INDEPENDENT_AMBULATORY_CARE_PROVIDER_SITE_OTHER): Payer: PPO | Admitting: Family Medicine

## 2018-02-11 ENCOUNTER — Encounter: Payer: Self-pay | Admitting: Family Medicine

## 2018-02-11 VITALS — BP 110/72 | HR 96 | Temp 98.0°F | Resp 16 | Ht 66.54 in | Wt 216.0 lb

## 2018-02-11 DIAGNOSIS — M545 Low back pain: Secondary | ICD-10-CM | POA: Diagnosis not present

## 2018-02-11 DIAGNOSIS — R030 Elevated blood-pressure reading, without diagnosis of hypertension: Secondary | ICD-10-CM

## 2018-02-11 DIAGNOSIS — Z9109 Other allergy status, other than to drugs and biological substances: Secondary | ICD-10-CM

## 2018-02-11 DIAGNOSIS — G8929 Other chronic pain: Secondary | ICD-10-CM

## 2018-02-11 DIAGNOSIS — E1042 Type 1 diabetes mellitus with diabetic polyneuropathy: Secondary | ICD-10-CM | POA: Diagnosis not present

## 2018-02-11 LAB — POCT URINALYSIS DIP (MANUAL ENTRY)
Bilirubin, UA: NEGATIVE
Blood, UA: NEGATIVE
Glucose, UA: 500 mg/dL — AB
Leukocytes, UA: NEGATIVE
NITRITE UA: NEGATIVE
PH UA: 6 (ref 5.0–8.0)
PROTEIN UA: NEGATIVE mg/dL
Spec Grav, UA: 1.02 (ref 1.010–1.025)
UROBILINOGEN UA: 0.2 U/dL

## 2018-02-11 MED ORDER — LISINOPRIL 10 MG PO TABS: 10.0000 mg | ORAL_TABLET | Freq: Every day | ORAL | 1 refills | Status: AC

## 2018-02-11 MED ORDER — ADHESIVE REMOVER WIPES MISC: 1.0000 [IU] | 99 refills | Status: AC | PRN

## 2018-02-11 MED ORDER — MONTELUKAST SODIUM 10 MG PO TABS: 10.0000 mg | ORAL_TABLET | Freq: Every day | ORAL | 1 refills | Status: AC

## 2018-02-11 MED ORDER — LEVOCETIRIZINE DIHYDROCHLORIDE 5 MG PO TABS: 5.0000 mg | ORAL_TABLET | Freq: Every evening | ORAL | 1 refills | Status: AC

## 2018-02-11 NOTE — Patient Instructions (Signed)
Read over handouts given and call if questions. ? ?

## 2018-02-11 NOTE — Patient Instructions (Addendum)
For your trip home next month, I recommend using AFRIN nasal spray (that is the [facility] name, generic name is oxymetazoline nasal spray and you can use 2-3 sprays into each nostril every 10-12 hours beginning about 24 hrs hours before your flight leaves. Do not use more than 6 sprays into each nare every 24 hrs and stop after 3 total days of use. If using longer than 5 days, you will get much more congestion when you stop.  In the meantime for continued allergy symptoms, try the xyzal 5mg  in the a.m. and the montelukast 10 mg.   I think you would benefit from chiropractor evaluation and treatment.  Consider: Damion Rodulfo and team at [facility] are great. Jacqualyn Posey at [facility] also have good results.  - Call on Monday for an appointment (check out websites online this weekend to see which would be most convenient for you. You do not need a referral for your insurance to cover this.  Try to make time for an appointment this week .Take the CD of the xrays that we did today with you to that visit.  Usually it will require chiropractor visits several times a week for several weeks to get permanent relief.   I have heard from patients that they have gotten good relief with: [facility] [email or web address]    IF you received an x-ray today, you will receive an invoice from [facility]. Please contact [facility] at [phone] with questions or concerns regarding your invoice.   IF you received labwork today, you will receive an invoice from [facility]. Please contact [facility] at [phone] with questions or concerns regarding your invoice.   Our billing staff will not be able to assist you with questions regarding bills from these companies.  You will be contacted with the lab results as soon as they are available.  The fastest way to get your results is to activate your My Chart account. Instructions are located on the last page of this paperwork. If you have not heard from [place] regarding the results in 2 weeks, please contact this office.      How to Take Your Blood Pressure Blood pressure is a measurement of how strongly your blood is pressing against the walls of your arteries. Arteries are blood vessels that carry blood from your heart throughout your body. Your health care provider takes your blood pressure at each office visit. You can also take your own blood pressure at home with a blood pressure machine. You may need to take your own blood pressure:  To confirm a diagnosis of high blood pressure (hypertension).  To monitor your blood pressure over time.  To make sure your blood pressure medicine is working.  Supplies needed: To take your blood pressure, you will need a blood pressure machine. You can buy a blood pressure machine, or blood pressure monitor, at most drugstores or online. There are several types of home blood pressure monitors. When choosing one, consider the following:  Choose a monitor that has an arm cuff.  Choose a monitor that wraps snugly around your upper arm. You should be able to fit only one finger between your arm and the cuff.  Do not choose a monitor that measures your blood pressure from your wrist or finger.  Your health care provider can suggest a reliable monitor that will meet your needs. How to prepare To get the most accurate  reading, avoid the following for 30 minutes before you check your blood pressure:  Drinking caffeine.  Drinking alcohol.  Eating.  Smoking.  Exercising.  Five minutes before you check your blood pressure:  Empty your bladder.  Sit quietly without talking in a dining chair, rather than in a soft couch or armchair.  How to take your blood pressure To check your blood pressure, follow the instructions in the manual that came  with your blood pressure monitor. If you have a digital blood pressure monitor, the instructions may be as follows: 1. Sit up straight. 2. Place your feet on the floor. Do not cross your ankles or legs. 3. Rest your left arm at the level of your heart on a table or desk or on the arm of a chair. 4. Pull up your shirt sleeve. 5. Wrap the blood pressure cuff around the upper part of your left arm, 1 inch (2.5 cm) above your elbow. It is best to wrap the cuff around bare skin. 6. Fit the cuff snugly around your arm. You should be able to place only one finger between the cuff and your arm. 7. Position the cord inside the groove of your elbow. 8. Press the power button. 9. Sit quietly while the cuff inflates and deflates. 10. Read the digital reading on the monitor screen and write it down (record it). 11. Wait 2-3 minutes, then repeat the steps, starting at step 1.  What does my blood pressure reading mean? A blood pressure reading consists of a higher number over a lower number. Ideally, your blood pressure should be below 120/80. The first ("top") number is called the systolic pressure. It is a measure of the pressure in your arteries as your heart beats. The second ("bottom") number is called the diastolic pressure. It is a measure of the pressure in your arteries as the heart relaxes. Blood pressure is classified into four stages. The following are the stages for adults who do not have a short-term serious illness or a chronic condition. Systolic pressure and diastolic pressure are measured in a unit called mm Hg. Normal  Systolic pressure: below 120.  Diastolic pressure: below 80. Elevated  Systolic pressure: 120-129.  Diastolic pressure: below 80. Hypertension stage 1  Systolic pressure: 130-139.  Diastolic pressure: 80-89. Hypertension stage 2  Systolic pressure: 140 or above.  Diastolic pressure: 90 or above. You can have prehypertension or hypertension even if only the  systolic or only the diastolic number in your reading is higher than normal. Follow these instructions at home:  Check your blood pressure as often as recommended by your health care provider.  Take your monitor to the next appointment with your health care provider to make sure: ? That you are using it correctly. ? That it provides accurate readings.  Be sure you understand what your goal blood pressure numbers are.  Tell your health care provider if you are having any side effects from blood pressure medicine. Contact a health care provider if:  Your blood pressure is consistently high. Get help right away if:  Your systolic blood pressure is higher than 180.  Your diastolic blood pressure is higher than 110. This information is not intended to replace advice given to you by your health care provider. Make sure you discuss any questions you have with your health care provider. Document Released: 03/26/2016 Document Revised: 06/08/2016 Document Reviewed: 03/26/2016 Elsevier Interactive Patient Education  2018 [employer] Inc.   Preventing Hypertension Hypertension, commonly called high blood  pressure, is when the force of blood pumping through the arteries is too strong. Arteries are blood vessels that carry blood from the heart throughout the body. Over time, hypertension can damage the arteries and decrease blood flow to important parts of the body, including the brain, heart, and kidneys. Often, hypertension does not cause symptoms until blood pressure is very high. For this reason, it is important to have your blood pressure checked on a regular basis. Hypertension can often be prevented with diet and lifestyle changes. If you already have hypertension, you can control it with diet and lifestyle changes, as well as medicine. What nutrition changes can be made? Maintain a healthy diet. This includes:  Eating less salt (sodium). Ask your health care provider how much sodium is safe  for you to have. The general recommendation is to consume less than 1 tsp (2,300 mg) of sodium a day. ? Do not add salt to your food. ? Choose low-sodium options when grocery shopping and eating out.  Limiting fats in your diet. You can do this by eating low-fat or fat-free dairy products and by eating less red meat.  Eating more fruits, vegetables, and whole grains. Make a goal to eat: ? 1-2 cups of fresh fruits and vegetables each day. ? 3-4 servings of whole grains each day.  Avoiding foods and beverages that have added sugars.  Eating fish that contain healthy fats (omega-3 fatty acids), such as mackerel or salmon.  If you need help putting together a healthy eating plan, try the DASH diet. This diet is high in fruits, vegetables, and whole grains. It is low in sodium, red meat, and added sugars. DASH stands for Dietary Approaches to Stop Hypertension. What lifestyle changes can be made?  Lose weight if you are overweight. Losing just 3?5% of your body weight can help prevent or control hypertension. ? For example, if your present weight is 200 lb (91 kg), a loss of 3-5% of your weight means losing 6-10 lb (2.7-4.5 kg). ? Ask your health care provider to help you with a diet and exercise plan to safely lose weight.  Get enough exercise. Do at least 150 minutes of moderate-intensity exercise each week. ? You could do this in short exercise sessions several times a day, or you could do longer exercise sessions a few times a week. For example, you could take a brisk 10-minute walk or bike ride, 3 times a day, for 5 days a week.  Find ways to reduce stress, such as exercising, meditating, listening to music, or taking a yoga class. If you need help reducing stress, ask your health care provider.  Do not smoke. This includes e-cigarettes. Chemicals in tobacco and nicotine products raise your blood pressure each time you smoke. If you need help quitting, ask your health care  provider.  Avoid alcohol. If you drink alcohol, limit alcohol intake to no more than 1 drink a day for nonpregnant women and 2 drinks a day for men. One drink equals 12 oz of beer, 5 oz of wine, or 1 oz of hard liquor. Why are these changes important? Diet and lifestyle changes can help you prevent hypertension, and they may make you feel better overall and improve your quality of life. If you have hypertension, making these changes will help you control it and help prevent major complications, such as:  Hardening and narrowing of arteries that supply blood to: ? Your heart. This can cause a heart attack. ? Your brain. This can  cause a stroke. ? Your kidneys. This can cause kidney failure.  Stress on your heart muscle, which can cause heart failure.  What can I do to lower my risk?  Work with your health care provider to make a hypertension prevention plan that works for you. Follow your plan and keep all follow-up visits as told by your health care provider.  Learn how to check your blood pressure at home. Make sure that you know your personal target blood pressure, as told by your health care provider. How is this treated? In addition to diet and lifestyle changes, your health care provider may recommend medicines to help lower your blood pressure. You may need to try a few different medicines to find what works best for you. You also may need to take more than one medicine. Take over-the-counter and prescription medicines only as told by your health care provider. Where to find support: Your health care provider can help you prevent hypertension and help you keep your blood pressure at a healthy level. Your local hospital or your community may also provide support services and prevention programs. The American Heart Association offers an online support network at: https://www.lee.net/ Where to find more information: Learn more about hypertension  from:  National Heart, Lung, and Blood Institute: https://www.peterson.org/  Centers for Disease Control and Prevention: AboutHD.co.nz  American Academy of Family Physicians: http://familydoctor.org/familydoctor/en/diseases-conditions/high-blood-pressure.printerview.all.html  Learn more about the DASH diet from:  National Heart, Lung, and Blood Institute: WedMap.it  Contact a health care provider if:  You think you are having a reaction to medicines you have taken.  You have recurrent headaches or feel dizzy.  You have swelling in your ankles.  You have trouble with your vision. Summary  Hypertension often does not cause any symptoms until blood pressure is very high. It is important to get your blood pressure checked regularly.  Diet and lifestyle changes are the most important steps in preventing hypertension.  By keeping your blood pressure in a healthy range, you can prevent complications like heart attack, heart failure, stroke, and kidney failure.  Work with your health care provider to make a hypertension prevention plan that works for you. This information is not intended to replace advice given to you by your health care provider. Make sure you discuss any questions you have with your health care provider. Document Released: 11/02/2015 Document Revised: 06/28/2016 Document Reviewed: 06/28/2016 Elsevier Interactive Patient Education  2018 [employer].   Managing Your Hypertension Hypertension is commonly called high blood pressure. This is when the force of your blood pressing against the walls of your arteries is too strong. Arteries are blood vessels that carry blood from your heart throughout your body. Hypertension forces the heart to work harder to pump blood, and may cause the arteries to become narrow or stiff. Having untreated or uncontrolled hypertension can cause heart attack, stroke,  kidney disease, and other problems. What are blood pressure readings? A blood pressure reading consists of a higher number over a lower number. Ideally, your blood pressure should be below 120/80. The first ("top") number is called the systolic pressure. It is a measure of the pressure in your arteries as your heart beats. The second ("bottom") number is called the diastolic pressure. It is a measure of the pressure in your arteries as the heart relaxes. What does my blood pressure reading mean? Blood pressure is classified into four stages. Based on your blood pressure reading, your health care provider may use the following stages to determine  what type of treatment you need, if any. Systolic pressure and diastolic pressure are measured in a unit called mm Hg. Normal  Systolic pressure: below 120.  Diastolic pressure: below 80. Elevated  Systolic pressure: 120-129.  Diastolic pressure: below 80. Hypertension stage 1  Systolic pressure: 130-139.  Diastolic pressure: 80-89. Hypertension stage 2  Systolic pressure: 140 or above.  Diastolic pressure: 90 or above. What health risks are associated with hypertension? Managing your hypertension is an important responsibility. Uncontrolled hypertension can lead to:  A heart attack.  A stroke.  A weakened blood vessel (aneurysm).  Heart failure.  Kidney damage.  Eye damage.  Metabolic syndrome.  Memory and concentration problems.  What changes can I make to manage my hypertension? Hypertension can be managed by making lifestyle changes and possibly by taking medicines. Your health care provider will help you make a plan to bring your blood pressure within a normal range. Eating and drinking  Eat a diet that is high in fiber and potassium, and low in salt (sodium), added sugar, and fat. An example eating plan is called the DASH (Dietary Approaches to Stop Hypertension) diet. To eat this way: ? Eat plenty of fresh fruits and  vegetables. Try to fill half of your plate at each meal with fruits and vegetables. ? Eat whole grains, such as whole wheat pasta, brown rice, or whole grain bread. Fill about one quarter of your plate with whole grains. ? Eat low-fat diary products. ? Avoid fatty cuts of meat, processed or cured meats, and poultry with skin. Fill about one quarter of your plate with lean proteins such as fish, chicken without skin, beans, eggs, and tofu. ? Avoid premade and processed foods. These tend to be higher in sodium, added sugar, and fat.  Reduce your daily sodium intake. Most people with hypertension should eat less than 1,500 mg of sodium a day.  Limit alcohol intake to no more than 1 drink a day for nonpregnant women and 2 drinks a day for men. One drink equals 12 oz of beer, 5 oz of wine, or 1 oz of hard liquor. Lifestyle  Work with your health care provider to maintain a healthy body weight, or to lose weight. Ask what an ideal weight is for you.  Get at least 30 minutes of exercise that causes your heart to beat faster (aerobic exercise) most days of the week. Activities may include walking, swimming, or biking.  Include exercise to strengthen your muscles (resistance exercise), such as weight lifting, as part of your weekly exercise routine. Try to do these types of exercises for 30 minutes at least 3 days a week.  Do not use any products that contain nicotine or tobacco, such as cigarettes and e-cigarettes. If you need help quitting, ask your health care provider.  Control any long-term (chronic) conditions you have, such as high cholesterol or diabetes. Monitoring  Monitor your blood pressure at home as told by your health care provider. Your personal target blood pressure may vary depending on your medical conditions, your age, and other factors.  Have your blood pressure checked regularly, as often as told by your health care provider. Working with your health care provider  Review all  the medicines you take with your health care provider because there may be side effects or interactions.  Talk with your health care provider about your diet, exercise habits, and other lifestyle factors that may be contributing to hypertension.  Visit your health care provider regularly. Your health  care provider can help you create and adjust your plan for managing hypertension. Will I need medicine to control my blood pressure? Your health care provider may prescribe medicine if lifestyle changes are not enough to get your blood pressure under control, and if:  Your systolic blood pressure is 130 or higher.  Your diastolic blood pressure is 80 or higher.  Take medicines only as told by your health care provider. Follow the directions carefully. Blood pressure medicines must be taken as prescribed. The medicine does not work as well when you skip doses. Skipping doses also puts you at risk for problems. Contact a health care provider if:  You think you are having a reaction to medicines you have taken.  You have repeated (recurrent) headaches.  You feel dizzy.  You have swelling in your ankles.  You have trouble with your vision. Get help right away if:  You develop a severe headache or confusion.  You have unusual weakness or numbness, or you feel faint.  You have severe pain in your chest or abdomen.  You vomit repeatedly.  You have trouble breathing. Summary  Hypertension is when the force of blood pumping through your arteries is too strong. If this condition is not controlled, it may put you at risk for serious complications.  Your personal target blood pressure may vary depending on your medical conditions, your age, and other factors. For most people, a normal blood pressure is less than 120/80.  Hypertension is managed by lifestyle changes, medicines, or both. Lifestyle changes include weight loss, eating a healthy, low-sodium diet, exercising more, and limiting  alcohol. This information is not intended to replace advice given to you by your health care provider. Make sure you discuss any questions you have with your health care provider. Document Released: 07/12/2012 Document Revised: 09/15/2016 Document Reviewed: 09/15/2016 Elsevier Interactive Patient Education  [employer].

## 2018-02-11 NOTE — Progress Notes (Addendum)
Subjective:  By signing my name below, I, Stann Ore, attest that this documentation has been prepared under the direction and in the presence of Norberto Sorenson, MD. Electronically Signed: Stann Ore, Scribe. 02/11/2018 , 11:06 AM .  Patient was seen in Room 2 .   Patient ID: Zachary Hicks, male    DOB: 1990-12-05, 27 y.o.   MRN: [id number] Chief Complaint  Patient presents with  . Elavated Blood Pressure     2 month follow-up   . Diabetes   HPI Danyael Alipio is a 27 y.o. male who presents to Primary Care at [facility] for follow up. He is graduating on May 8th.   HTN He called our office about 1 week prior concerned as his eye doctor had noted "high pressure" in his eyes, and complaining of headache extending over the back of his head. He also felt heavy but improved with laying down. He's been checking [facility] at [facility] which has been running 140s/90s. He's switched to taking Lisinopril 10mg  at night as he felt dizziness and fatigue when taken during the day. He also wondered if BP was worsened by calcium and B-12 supplements. His BP at multiple nutrition and endocrinology visits over the past 2 months have been well controlled in 110-120s/80-90.   Patient states he felt tired and dizzy when he took Lisinopril in the morning, so his pharmacist instructed him to take it at night. He denies feeling drowsiness symptoms during the day.   Seasonal allergies He's had some congestion with seasonal allergies. He's been taking OTC Allegra with intermittent relief. She also has some itching in his eyes. He denies relief with Azelastine nasal spray, as it felt funny and without relief. His eye doctor has given him some eye drops for dryness; seen at [place] [facility] in March.   DM He was seen by endocrinologist, Dr. Everardo All to get started on insulin pump, last seen yesterday with pump settings entered and adjusted with follow up arranged in 3 weeks. He has also regularly seen  nutritionist.   He is due for pneumonia vaccine but will defer.   Back pain He's been taking Meloxicam and Flexeril for about a month now, but without relief. When he walks or stands for an extended period of time, his back pain is aggravated. He's been stretching and applying heat to his low back for some relief. He hasn't had xray done, or has seen chiropractor.   Past Medical History:  Diagnosis Date  . Diabetes mellitus without complication (HCC)    History reviewed. No pertinent surgical history. Prior to Admission medications   Medication Sig Start Date End Date Taking? Authorizing Provider  atorvastatin (LIPITOR) 20 MG tablet Take 1 tablet (20 mg total) by mouth daily. 12/22/17   Sherren Mocha, MD  azelastine (ASTELIN) 0.1 % nasal spray Place 2 sprays into both nostrils at bedtime. Use in each nostril as directed 11/23/17   Sherren Mocha, MD  Continuous Blood Gluc Sensor (FREESTYLE LIBRE 14 DAY SENSOR) MISC 1 Device by Does not apply route every 14 (fourteen) days. 12/30/17   Romero Belling, MD  Cyanocobalamin (VITAMIN B-12) 1000 MCG SUBL Place 1 tablet (1,000 mcg total) under the tongue daily. Patient not taking: Reported on 02/10/2018 12/22/17   Sherren Mocha, MD  cyclobenzaprine (FLEXERIL) 10 MG tablet Take 1 tablet (10 mg total) by mouth at bedtime. Patient not taking: Reported on 02/10/2018 12/22/17   Sherren Mocha, MD  hydrocortisone 2.5 %  ointment Apply topically 2 (two) times daily. 11/23/17   Sherren MochaShaw, Jaceyon Strole N, MD  Insulin Glargine (BASAGLAR KWIKPEN) 100 UNIT/ML SOPN Inject 0.55 mLs (55 Units total) into the skin at bedtime. Patient not taking: Reported on 02/10/2018 11/23/17   Sherren MochaShaw, Jagjit Riner N, MD  insulin lispro (HUMALOG [facility]) 100 UNIT/ML KiwkPen 10-15 with breakfast; and 25-30 with lunch and supper, and pen needles 3/day 01/03/18   Romero BellingEllison, Sean, MD  lisinopril (PRINIVIL,ZESTRIL) 10 MG tablet TAKE 1 TABLET BY MOUTH EVERY DAY 01/18/18   Sherren MochaShaw, Keaun Schnabel N, MD  Selenium Sulfide 2.25 % SHAM Apply 1 application  topically daily as needed. 12/22/17   Sherren MochaShaw, Akari Defelice N, MD  Vitamin D, Ergocalciferol, (DRISDOL) 50000 units CAPS capsule Take 1 capsule (50,000 Units total) by mouth every 7 (seven) days. Patient not taking: Reported on 02/10/2018 12/22/17   Sherren MochaShaw, Brittony Billick N, MD   No Known Allergies Family History  Problem Relation Age of Onset  . Diabetes Neg Hx    Social History   Socioeconomic History  . Marital status: Married    Spouse name: Not on file  . Number of children: Not on file  . Years of education: Not on file  . Highest education level: Not on file  Occupational History  . Not on file  Social Needs  . Financial resource strain: Not on file  . Food insecurity:    Worry: Not on file    Inability: Not on file  . Transportation needs:    Medical: Not on file    Non-medical: Not on file  Tobacco Use  . Smoking status: Never Smoker  . Smokeless tobacco: Never Used  Substance and Sexual Activity  . Alcohol use: No  . Drug use: No  . Sexual activity: Not on file  Lifestyle  . Physical activity:    Days per week: Not on file    Minutes per session: Not on file  . Stress: Not on file  Relationships  . Social connections:    Talks on phone: Not on file    Gets together: Not on file    Attends religious service: Not on file    Active member of club or organization: Not on file    Attends meetings of clubs or organizations: Not on file    Relationship status: Not on file  Other Topics Concern  . Not on file  Social History Narrative  . Not on file   Depression screen [facility] 2/9 02/11/2018 02/10/2018 12/22/2017  Decreased Interest 0 0 0  Down, Depressed, Hopeless 0 0 0  PHQ - 2 Score 0 0 0    Review of Systems  Constitutional: Negative for fatigue and unexpected weight change.  HENT: Positive for rhinorrhea.   Eyes: Negative for visual disturbance.  Respiratory: Negative for cough, chest tightness and shortness of breath.   Cardiovascular: Negative for chest pain, palpitations and leg  swelling.  Gastrointestinal: Negative for abdominal pain and blood in stool.  Musculoskeletal: Positive for back pain.  Allergic/Immunologic: Positive for environmental allergies.  Neurological: Negative for dizziness, light-headedness and headaches.       Objective:   Physical Exam  Constitutional: He is oriented to person, place, and time. He appears well-developed and well-nourished. No distress.  HENT:  Head: Normocephalic and atraumatic.  Eyes: Pupils are equal, round, and reactive to light. EOM are normal.  Neck: Neck supple. No thyromegaly present.  Cardiovascular: Normal rate, regular rhythm, S1 normal, S2 normal and normal heart sounds.  No murmur heard. Pulmonary/Chest:  Breath sounds normal. No respiratory distress. He has no wheezes.  Musculoskeletal: Normal range of motion.  Lumbar spine: no tenderness over the lumbar process, no tenderness over the lumbar paraspinal, describes pain over bilateral low lumbar paraspinal radiating laterally to the top of SI joint  Neurological: He is alert and oriented to person, place, and time.  Skin: Skin is warm and dry.  Psychiatric: He has a normal mood and affect. His behavior is normal.  Nursing note and vitals reviewed.   BP 110/72   Pulse 96   Temp 98 F (36.7 C) (Oral)   Resp 16   Ht 5' 6.54" (1.69 m)   Wt 216 lb (98 kg)   SpO2 95%   BMI 34.30 kg/m   EKG: NSR, no acute ischemic changes noted. No EKG done prior for comparison.  I have personally reviewed the EKG tracing and agree with the computer interpretation.   Results for orders placed or performed in visit on 02/11/18  POCT urinalysis dipstick  Result Value Ref Range   Color, UA yellow yellow   Clarity, UA clear clear   Glucose, UA =500 (A) negative mg/dL   Bilirubin, UA negative negative   Ketones, POC UA trace (5) (A) negative mg/dL   Spec Grav, UA 1.610 9.604 - 1.025   Blood, UA negative negative   pH, UA 6.0 5.0 - 8.0   Protein Ur, POC negative negative  mg/dL   Urobilinogen, UA 0.2 0.2 or 1.0 E.U./dL   Nitrite, UA Negative Negative   Leukocytes, UA Negative Negative        Assessment & Plan:   1. Elevated blood pressure reading - poss pt sxs actually due to hypotension but resoled with taking lisinopril 10 qhs rather than qam so cont  2. Type 1 diabetes mellitus with diabetic polyneuropathy (HCC) - seeing Dr. Everardo All  3. Chronic bilateral low back pain without sciatica - L-spine X-rays ordered and images taken but then voided/deleted with order cancelled as unable to obtain overread currently due to tech issue with canopy access this weekend; because patient is leaving the country in 3 weeks, I want to ensure he has the imaging with results, radiology read, and cd of xrays with him as well as detailed treatment plan prior, so will have him obtain this imaging at his chiropractor's office  4.      Environmental allergies - concerned about ETD and pain on prolonged flight - rec Afrin for time just during flight - gave warning to not use for > sev d due to rebound effect and start below antihistamines now.  Orders Placed This Encounter  Procedures  . Basic metabolic panel    Order Specific Question:   Has the patient fasted?    Answer:   No  . POCT urinalysis dipstick  . EKG 12-Lead    Meds ordered this encounter  Medications  . montelukast (SINGULAIR) 10 MG tablet    Sig: Take 1 tablet (10 mg total) by mouth at bedtime.    Dispense:  30 tablet    Refill:  1  . levocetirizine (XYZAL) 5 MG tablet    Sig: Take 1 tablet (5 mg total) by mouth every evening.    Dispense:  30 tablet    Refill:  1  . lisinopril (PRINIVIL,ZESTRIL) 10 MG tablet    Sig: Take 1 tablet (10 mg total) by mouth at bedtime.    Dispense:  90 tablet    Refill:  1  . Ostomy Supplies (ADHESIVE  REMOVER WIPES) MISC    Sig: 1 Units by Does not apply route as needed.    Dispense:  100 each    Refill:  prn    Any adhesive remover wipes ok, for insulin pump and glucose  sensor; ICD10 E10.42   I personally performed the services described in this documentation, which was scribed in my presence. The recorded information has been reviewed and considered, and addended by me as needed.   Norberto Sorenson, M.D.  Primary Care at [facility] [address] [place], [place] [address] [phone] phone [phone] fax  08/06/18 9:51 PM

## 2018-02-11 NOTE — Progress Notes (Signed)
Patient did a pod change with some assistance from me.  He was encouraged to read over resource manual to review this, before next pod change.   Discussed sick day guidelines, high blood sugar protocol, pod rotations, and temp basal rates--when and how to do this.  He was given handouts on each topic, and encouraged to read over. We reviewed each topic on the checklist, and he signed this, indicating that he understood all topics with no final questions.

## 2018-02-12 LAB — BASIC METABOLIC PANEL
BUN/Creatinine Ratio: 9 (ref 9–20)
BUN: 11 mg/dL (ref 6–20)
CHLORIDE: 101 mmol/L (ref 96–106)
CO2: 25 mmol/L (ref 20–29)
Calcium: 9.3 mg/dL (ref 8.7–10.2)
Creatinine, Ser: 1.22 mg/dL (ref 0.76–1.27)
GFR calc Af Amer: 93 mL/min/{1.73_m2} (ref >59)
GFR calc non Af Amer: 81 mL/min/{1.73_m2} (ref >59)
GLUCOSE: 197 mg/dL — AB (ref 65–99)
POTASSIUM: 3.9 mmol/L (ref 3.5–5.2)
SODIUM: 138 mmol/L (ref 134–144)

## 2018-02-13 NOTE — Progress Notes (Signed)
Diabetes Self-Management Education  Visit Type: Follow-up  Appt. Start Time: 1115 Appt. End Time: 1215  02/13/2018  [name], identified by name and date of birth, is a 27 y.o. male with a diagnosis of Diabetes: 1 Diabetes for 10 years.  He got the Omnipod pump 1 week ago and states that things are going well.  They are making changes to the basal rates.  He has begun carbohydrate counting and states that he can tell that he is doing this accurately as his blood sugar has been in the normal range when checked post meal. His insulin to carb ratio has been changed to 1:9. He complains that he has gained about 20 lbs.  He is not active.  Patient lives with his wife and is graduating from [employer] in [employer].  He is then moving back to [place].  Currently they are eating most meals out.     ASSESSMENT  Height 5\' 6"  (1.676 m), weight 218 lb 4.1 oz (99 kg). Body mass index is 35.23 kg/m.  Diabetes Self-Management Education - 02/10/18 1241      Visit Information   Visit Type  Follow-up      Psychosocial Assessment   Patient Belief/Attitude about Diabetes  Motivated to manage diabetes    Self-care barriers  None    Self-management support  Doctor's office;Family    Other persons present  Patient    Patient Concerns  Nutrition/Meal planning;Other (comment) carb counting    Special Needs  None    Preferred Learning Style  No preference indicated    Learning Readiness  Ready    How often do you need to have someone help you when you read instructions, pamphlets, or other written materials from your doctor or pharmacy?  1 - Never    What is the last grade level you completed in school?  6 years college      Pre-Education Assessment   Patient understands the diabetes disease and treatment process.  Demonstrates understanding / competency    Patient understands incorporating nutritional management into lifestyle.  Needs Review    Patient undertands incorporating physical  activity into lifestyle.  Needs Review    Patient understands using medications safely.  Demonstrates understanding / competency    Patient understands monitoring blood glucose, interpreting and using results  Demonstrates understanding / competency    Patient understands prevention, detection, and treatment of acute complications.  Demonstrates understanding / competency    Patient understands prevention, detection, and treatment of chronic complications.  Demonstrates understanding / competency    Patient understands how to develop strategies to address psychosocial issues.  Demonstrates understanding / competency    Patient understands how to develop strategies to promote health/change behavior.  Needs Review      Complications   Last HgB A1C per patient/outside source  7.5 % 01/16/18 decreased from 8.6% 11/23/17    How often do you check your blood sugar?  > 4 times/day Libre      Dietary Intake   Breakfast  latte and roll    Snack (morning)  none    Lunch  chicken kabob and rice    Snack (afternoon)  none    Dinner  Fettuchini Alfredo, Insurance claims handler (evening)  none    Beverage(s)  latte, water      Exercise   Exercise Type  ADL's stopped due to increased back pain.  he has an appointment for this.      Patient Education  Previous Diabetes Education  Yes (please comment) mulitple recent with Bonita QuinLinda CDE    Nutrition management   Carbohydrate counting;Meal options for control of blood glucose level and chronic complications.    Physical activity and exercise   Identified with patient nutritional and/or medication changes necessary with exercise.    Personal strategies to promote health  Lifestyle issues that need to be addressed for better diabetes care      Individualized Goals (developed by patient)   Nutrition  General guidelines for healthy choices and portions discussed;Other (comment) carb counting    Medications  take my medication as prescribed    Monitoring   Other  (comment) continue CGM      Post-Education Assessment   Patient understands the diabetes disease and treatment process.  Demonstrates understanding / competency    Patient understands incorporating nutritional management into lifestyle.  Demonstrates understanding / competency    Patient undertands incorporating physical activity into lifestyle.  Demonstrates understanding / competency    Patient understands using medications safely.  Demonstrates understanding / competency    Patient understands monitoring blood glucose, interpreting and using results  Demonstrates understanding / competency    Patient understands prevention, detection, and treatment of acute complications.  Demonstrates understanding / competency    Patient understands prevention, detection, and treatment of chronic complications.  Demonstrates understanding / competency    Patient understands how to develop strategies to address psychosocial issues.  Demonstrates understanding / competency    Patient understands how to develop strategies to promote health/change behavior.  Demonstrates understanding / competency      Outcomes   Expected Outcomes  Demonstrated interest in learning. Expect positive outcomes    Future DMSE  PRN    Program Status  Completed      Subsequent Visit   Since your last visit have you continued or begun to take your medications as prescribed?  Yes       Individualized Plan for Diabetes Self-Management Training:   Learning Objective:  Patient will have a greater understanding of diabetes self-management. Patient education plan is to attend individual and/or group sessions per assessed needs and concerns. Discussed carbohydrate counting as well as healthy eating and healthy eating when eating out.  Discussed portion sizes as patient will eat 4 cups of rice in one sitting and is not eating vegetables.  Discussed foods culturally that he may eat and he states that he has found a list of foods in  [place]ab[place] to help with carbohydrate counting.  Plan:   Patient Instructions  Continue to practice carbohydrate counting. Begin paying attention to when you are full to avoid overeating.  Eat slowly away from distraction.  Enjoy your food when you are eating. Choose baked or grilled more than fried more often.  Resources:  Calorie king.com  google the food  google [employer]pressthe restaurant name and nutrition  See app list.   Expected Outcomes:  Demonstrated interest in learning. Expect positive outcomes  Education material provided: Meal plan card and My Plate  If problems or questions, patient to contact team via:  Phone  Future DSME appointment: PRN

## 2018-02-14 ENCOUNTER — Other Ambulatory Visit: Payer: Self-pay

## 2018-02-14 ENCOUNTER — Telehealth: Payer: Self-pay | Admitting: Endocrinology

## 2018-02-14 MED ORDER — FREESTYLE LIBRE 14 DAY SENSOR MISC: 1.0000 | 3 refills | Status: DC

## 2018-02-14 MED ORDER — INSULIN LISPRO 100 UNIT/ML ~~LOC~~ SOLN: SUBCUTANEOUS | 11 refills | Status: AC

## 2018-02-14 NOTE — Telephone Encounter (Signed)
I called and LVM that prescriptions had been sent to patient's pharmacy.

## 2018-02-14 NOTE — Telephone Encounter (Signed)
Continuous Blood Gluc Sensor (FREESTYLE LIBRE 14 DAY SENSOR) MISC   HUMALOG KWIKPEN 200 UNIT/ML SOPN For the pump.  States that the pharmacy only [facility] not for the pump and they needed a new prescription for this.  Please advise     [id number] [place], [place] - [address]

## 2018-02-15 ENCOUNTER — Telehealth: Payer: Self-pay | Admitting: Endocrinology

## 2018-02-15 NOTE — Telephone Encounter (Signed)
I called and notified patient that I had sent in prescriptions.

## 2018-02-15 NOTE — Telephone Encounter (Signed)
Patient is returning the call from our office.  Please advise

## 2018-02-16 ENCOUNTER — Telehealth: Payer: Self-pay | Admitting: Family Medicine

## 2018-02-16 NOTE — Telephone Encounter (Signed)
Mychart message sent to pt about making an apt °

## 2018-02-16 NOTE — Telephone Encounter (Signed)
Please schedule

## 2018-02-16 NOTE — Telephone Encounter (Signed)
Called patient for follow up from carbohydrate counting appointment.  Patient reports no questions at this time. Oran ReinLaura Hershy Flenner, RD, LDN, CDE

## 2018-02-16 NOTE — Telephone Encounter (Signed)
Copied from CRM [phone]. Topic: Quick Communication - See Telephone Encounter >> Feb 16, 2018 11:07 AM Cipriano BunkerLambe, Annette S wrote: CRM for notification.  Pt. Had xray last week (Saturday) machine was broke was to call back to have another appt.  He has not heard from them. Asking if needs another order put in or what he needs to do.   This was to be done right away and is still waiting    See Telephone encounter for: 02/16/18.

## 2018-02-20 ENCOUNTER — Ambulatory Visit (INDEPENDENT_AMBULATORY_CARE_PROVIDER_SITE_OTHER): Payer: PPO

## 2018-02-20 ENCOUNTER — Other Ambulatory Visit: Payer: Self-pay

## 2018-02-20 ENCOUNTER — Encounter: Payer: Self-pay | Admitting: Physician Assistant

## 2018-02-20 ENCOUNTER — Ambulatory Visit (INDEPENDENT_AMBULATORY_CARE_PROVIDER_SITE_OTHER): Payer: PPO | Admitting: Physician Assistant

## 2018-02-20 DIAGNOSIS — M545 Low back pain, unspecified: Secondary | ICD-10-CM

## 2018-02-20 DIAGNOSIS — R030 Elevated blood-pressure reading, without diagnosis of hypertension: Secondary | ICD-10-CM

## 2018-02-20 DIAGNOSIS — M4316 Spondylolisthesis, lumbar region: Secondary | ICD-10-CM | POA: Diagnosis not present

## 2018-02-20 DIAGNOSIS — G8929 Other chronic pain: Secondary | ICD-10-CM

## 2018-02-20 NOTE — Patient Instructions (Addendum)
I think you would benefit from chiropractor evaluation and treatment.   Consider: Damion Rodulfo and team at [facility] are great. Jacqualyn PoseyEugene Lewis at [facility] also have good results.   - Call for an appointment (check out websites to see which would be most convenient for you.) You do not need a referral for your insurance to cover this.  Try to make time for an appointment this week. Take the CD of the xrays that we did today with you to that visit.  Usually it will require chiropractor visits several times a week for several weeks to get permanent relief.    I have heard from patients that they have gotten good relief with: [facility] [email or web address]   Spondylolisthesis Spondylolisthesis is when one of the bones in the spine (vertebrae) slips forward and out of place. This most commonly occurs in the lower back (lumbar spine), but it can happen anywhere along the spine. A vertebra may move out of place due to a previous back injury. In some cases, the injury may go unnoticed until the vertebra moves out of place later in life. Spondylolisthesis may also be caused by an injury or a condition that affects the bones. What are the causes? This condition may be caused by:  Injury (trauma). This is often a result of doing sports or physical activities that: ? Put a lot of strain on the bones in the lower back. ? Involve repetitive overstretching (hyperextension) of the spine.  A condition that affects the bones, such as osteoarthritis or cancer.  Changes in the spine that happen due to age-related wear and tear.  What increases the risk? The following factors may make you more likely to develop this condition:  Participating in sports or activities that put a lot of strain on the lower back, including: ? Gymnastics. ? Figure  skating. ? Weight lifting. ? Football.  Having a condition that affects the bones.  Being older than age 28.  What are the signs or symptoms? Symptoms of this condition may include:  Mild to severe pain in the legs, lower back, or buttocks.  An abnormal way of walking (abnormal gait).  Poor posture.  Muscle stiffness, specifically in the hamstrings. The hamstrings are in the backs of the thighs.  Weakness, numbness, or a tingling sensation in the legs.  Symptoms may get worse when standing, and they may temporarily get better when sitting down or bending forward. In some cases, there may be no symptoms of this condition. How is this diagnosed?  This condition may be diagnosed based on:  Your symptoms.  Your medical history.  A physical exam. ? Your health care provider may push on certain areas to determine the source of your pain. ? You may be asked to bend forward, backward, and side to side so your health care provider can assess the severity of your pain and your range of motion.  Imaging tests, such as: ? X-rays. ? CT scan. ? MRI.  How is this treated? Treatment for this condition may include:  Resting. This may involve avoiding or modifying activities that put strain on your back until your symptoms improve.  Medicines to help relieve pain.  NSAIDs to help reduce swelling and discomfort.  Injections of medicine (cortisone) in your back. These injections can to help relieve pain and numbness.  A brace to stabilize and support your back.  Physical therapy. This  may include working with an occupational therapist or physical therapist who can teach you how to reduce pressure on your back while you do everyday activities.  Surgery. This may be needed if: ? Other treatment methods do not improve your condition. ? Your symptoms do not go away after 3-6 months. ? You are unable to walk or stand. ? You have severe pain.  Follow these instructions at home: If  you have a brace:  Wear it as told by your health care provider. Remove it only as told by your health care provider.  Do not let your brace get wet if it is not waterproof.  Keep the brace clean. Driving  Do not drive or operate heavy machinery until you know how your pain medicine affects you.  Ask your health care provider when it is safe to drive if you have a back brace. Activity  Rest and return to your normal activities as told by your health care provider. Ask your health care provider what activities are safe for you.  Avoid activities that take a lot of effort (are strenuous) for as long as told by your health care provider.  Do exercises as told by your health care provider. General instructions  Take over-the-counter and prescription medicines only as told by your health care provider.  If you have questions or concerns about safety while taking pain medicine, talk with your health care provider.  Do not use any tobacco products, such as cigarettes, chewing tobacco, and e-cigarettes. Tobacco can delay bone healing. If you need help quitting, ask your health care provider.  Keep all follow-up visits as told by your health care provider. This is important. How is this prevented?  Warm up and stretch before being active.  Cool down and stretch after being active.  Give your body time to rest between periods of activity.  Make sure to use equipment that fits you.  Be safe and responsible while being active to avoid falls.  Do at least 150 minutes of moderate-intensity exercise each week, such as brisk walking or water aerobics.  Maintain physical fitness, including: ? Strength. ? Flexibility. ? Cardiovascular fitness. ? Endurance. Contact a health care provider if:  You have pain that gets worse or does not get better. Get help right away if:  You have severe back pain.  You develop weakness or numbness in your legs.  You are unable to stand or  walk. This information is not intended to replace advice given to you by your health care provider. Make sure you discuss any questions you have with your health care provider. Document Released: 10/18/2005 Document Revised: 06/24/2016 Document Reviewed: 07/29/2015 Elsevier Interactive Patient Education  2018 [employer].   IF you received an x-ray today, you will receive an invoice from [facility]. Please contact [facility] at [phone] with questions or concerns regarding your invoice.   IF you received labwork today, you will receive an invoice from [facility]. Please contact [facility] at [phone] with questions or concerns regarding your invoice.   Our billing staff will not be able to assist you with questions regarding bills from these companies.  You will be contacted with the lab results as soon as they are available. The fastest way to get your results is to activate your My Chart account. Instructions are located on the last page of this paperwork. If you have not heard from [place] regarding the results in 2 weeks, please contact this office.

## 2018-02-20 NOTE — Progress Notes (Signed)
Zachary Hicks  MRN: [id number] DOB: 1991/04/30  PCP: Sherren Mocha, MD  Subjective:  Pt is a 27 year old male who PMH DM, HLD presents to clinic for back pain. He was here 4/13 for this problem and saw Dr. Clelia Croft. At that time the x-ray machine was down.  He started exercising a few months ago. Then started having pain with walking and standing up. Pain is of lower back. Worse with walking for long time. Radiates up both sides of his back. He has been applying heat and stretching. He has not been to chiropractor - he needs x-rays here as the machine was down last time he was here.  ROS below.   Blood pressure today is 160/97.   Review of Systems  Constitutional: Negative for chills and fever.  Gastrointestinal: Negative for abdominal pain, nausea and vomiting.  Genitourinary: Negative for decreased urine volume, discharge, dysuria, enuresis, flank pain, frequency, hematuria, testicular pain and urgency.  Musculoskeletal: Positive for back pain and gait problem. Negative for arthralgias and joint swelling.  Neurological: Negative for weakness and numbness.    Patient Active Problem List   Diagnosis Date Noted  . Type 1 diabetes mellitus with diabetic polyneuropathy (HCC) 12/22/2017  . Polyneuropathy associated with underlying disease (HCC) 12/22/2017  . Hyperlipidemia LDL goal <100 12/22/2017  . Vitamin D deficiency 12/22/2017    Current Outpatient Medications on File Prior to Visit  Medication Sig Dispense Refill  . atorvastatin (LIPITOR) 20 MG tablet Take 1 tablet (20 mg total) by mouth daily. (Patient not taking: Reported on 02/20/2018) 90 tablet 1  . Continuous Blood Gluc Sensor (FREESTYLE LIBRE 14 DAY SENSOR) MISC 1 Device by Does not apply route every 14 (fourteen) days. 6 each 3  . insulin lispro (HUMALOG) 100 UNIT/ML injection Used daily via insulin pump. 4 vial 11  . levocetirizine (XYZAL) 5 MG tablet Take 1 tablet (5 mg total) by mouth every evening. 30 tablet 1  .  lisinopril (PRINIVIL,ZESTRIL) 10 MG tablet Take 1 tablet (10 mg total) by mouth at bedtime. 90 tablet 1  . montelukast (SINGULAIR) 10 MG tablet Take 1 tablet (10 mg total) by mouth at bedtime. (Patient not taking: Reported on 02/20/2018) 30 tablet 1  . ONE TOUCH ULTRA TEST test strip USE AS DIRECTED TO TEST 3 TIMES A DAY  3  . Ostomy Supplies (ADHESIVE REMOVER WIPES) MISC 1 Units by Does not apply route as needed. (Patient not taking: Reported on 02/20/2018) 100 each prn  . Selenium Sulfide 2.25 % SHAM Apply 1 application topically daily as needed. 180 mL 2   No current facility-administered medications on file prior to visit.     No Known Allergies   Objective:  BP (!) 160/97   Pulse 100   Temp 98.5 F (36.9 C) (Oral)   Resp 16   Ht 5' 6.25" (1.683 m)   Wt 216 lb 12.8 oz (98.3 kg)   SpO2 95%   BMI 34.73 kg/m   Physical Exam  Constitutional: He is oriented to person, place, and time. He appears well-developed and well-nourished.  Cardiovascular: Normal rate and regular rhythm.  Pulmonary/Chest: Effort normal. No respiratory distress.  Musculoskeletal:       Thoracic back: He exhibits normal range of motion, no tenderness and no bony tenderness.       Lumbar back: He exhibits normal range of motion, no tenderness, no bony tenderness, no deformity and no spasm.  Neurological: He is alert and oriented to person, place, and  time. He has normal strength. No sensory deficit. Gait normal.  Skin: Skin is warm and dry.  Psychiatric: He has a normal mood and affect. His behavior is normal. Judgment and thought content normal.  Vitals reviewed.   Dg Lumbar Spine Complete  Result Date: 02/20/2018 CLINICAL DATA:  Chronic low back pain. EXAM: LUMBAR SPINE - COMPLETE 4+ VIEW COMPARISON:  None. FINDINGS: No evidence of lumbar spine fracture. 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 Intervertebral disc spaces are preserved. Paravertebral soft tissues unremarkable.  IMPRESSION: No acute findings. 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 Electronically Signed   By: Elsie StainJohn T Curnes M.D.   On: 02/20/2018 12:36    Assessment and Plan :  1. Spondylolisthesis of lumbar region 2. Chronic bilateral low back pain without sciatica - DG Lumbar Spine Complete; Future - Pt presents for f/u low back pain. X-ray shows grade 1 spondylolisthesis. No concerning findings on PE. He plans to f/u with chiropractor. X-ray results printed on compact disc.  3. Elevated blood pressure reading - Recheck vitals   Marco CollieWhitney Brock Larmon, PA-C  Primary [facility] at [facility] 02/20/2018 12:10 PM

## 2018-03-02 ENCOUNTER — Ambulatory Visit (INDEPENDENT_AMBULATORY_CARE_PROVIDER_SITE_OTHER): Payer: PPO | Admitting: Endocrinology

## 2018-03-02 ENCOUNTER — Encounter: Payer: Self-pay | Admitting: Endocrinology

## 2018-03-02 VITALS — BP 108/78 | HR 96 | Wt 216.0 lb

## 2018-03-02 DIAGNOSIS — E1042 Type 1 diabetes mellitus with diabetic polyneuropathy: Secondary | ICD-10-CM | POA: Diagnosis not present

## 2018-03-02 LAB — POCT GLYCOSYLATED HEMOGLOBIN (HGB A1C): Hemoglobin A1C: 7.6

## 2018-03-02 MED ORDER — GLUCOSE BLOOD VI STRP: 1.0000 | ORAL_STRIP | Freq: Four times a day (QID) | 3 refills | Status: AC

## 2018-03-02 MED ORDER — FREESTYLE LIBRE 14 DAY SENSOR MISC: 1.0000 | 3 refills | Status: AC

## 2018-03-02 NOTE — Patient Instructions (Addendum)
Please continue these pump settings: basal rate of 1.6 units/hr.  bolus of 1 unit/9 grams carbohydrate.  continue correction bolus (which some people call "sensitivity," or "insulin.  sensitivity ratio," or just "isr") of 1 unit for each 25 by which your glucose exceeds 100.  For the Ramadan month, please take a basal rate of 1.2 units/hr, 24 HRS per day.  If it goes high, take a bolus as above.  If it goes low, drink a soft drink or glucose tablet.    Best wishes on your move to [place].

## 2018-03-02 NOTE — Progress Notes (Signed)
Subjective:    Patient ID: Zachary Hicks, male    DOB: [birth year], 27 y.o.   MRN: [id number]  HPI Pt returns for f/u of diabetes mellitus: DM type: 1 Dx'ed: 2009 Complications: polyneuropathy Therapy: insulin since dx DKA: never Severe hypoglycemia: never Pancreatitis: never Other: he will return to [place] in mid 5/19. Interval history: TDD is 57 units.  He takes these settings: basal rate of 1.6 units/hr. bolus of 1 unit/10 grams carbohydrate continue correction bolus (which some people call "sensitivity," or "insulin sensitivity ratio," or just "isr") of 1 unit for each 25 by which your glucose exceeds 100.   TDD is approx 90 units. He says glucose has been affected by recent flu-like illness.  Otherwise, he says cbg's have been well-controlled. Pt says he usually loses 1-2 lbs over the Ramadan month.   Past Medical History:  Diagnosis Date  . Diabetes mellitus without complication (HCC)     No past surgical history on file.  Social History   Socioeconomic History  . Marital status: Married    Spouse name: Not on file  . Number of children: Not on file  . Years of education: Not on file  . Highest education level: Not on file  Occupational History  . Not on file  Social Needs  . Financial resource strain: Not on file  . Food insecurity:    Worry: Not on file    Inability: Not on file  . Transportation needs:    Medical: Not on file    Non-medical: Not on file  Tobacco Use  . Smoking status: Never Smoker  . Smokeless tobacco: Never Used  Substance and Sexual Activity  . Alcohol use: No  . Drug use: No  . Sexual activity: Not on file  Lifestyle  . Physical activity:    Days per week: Not on file    Minutes per session: Not on file  . Stress: Not on file  Relationships  . Social connections:    Talks on phone: Not on file    Gets together: Not on file    Attends religious service: Not on file    Active member of club or organization: Not on  file    Attends meetings of clubs or organizations: Not on file    Relationship status: Not on file  . Intimate partner violence:    Fear of current or ex partner: Not on file    Emotionally abused: Not on file    Physically abused: Not on file    Forced sexual activity: Not on file  Other Topics Concern  . Not on file  Social History Narrative  . Not on file    Current Outpatient Medications on File Prior to Visit  Medication Sig Dispense Refill  . atorvastatin (LIPITOR) 20 MG tablet Take 1 tablet (20 mg total) by mouth daily. (Patient not taking: Reported on 02/20/2018) 90 tablet 1  . insulin lispro (HUMALOG) 100 UNIT/ML injection Used daily via insulin pump. 4 vial 11  . levocetirizine (XYZAL) 5 MG tablet Take 1 tablet (5 mg total) by mouth every evening. (Patient not taking: Reported on 03/02/2018) 30 tablet 1  . lisinopril (PRINIVIL,ZESTRIL) 10 MG tablet Take 1 tablet (10 mg total) by mouth at bedtime. 90 tablet 1  . montelukast (SINGULAIR) 10 MG tablet Take 1 tablet (10 mg total) by mouth at bedtime. (Patient not taking: Reported on 02/20/2018) 30 tablet 1  . Ostomy Supplies (ADHESIVE REMOVER WIPES) MISC 1 Units by Does  not apply route as needed. (Patient not taking: Reported on 02/20/2018) 100 each prn  . Selenium Sulfide 2.25 % SHAM Apply 1 application topically daily as needed. 180 mL 2   No current facility-administered medications on file prior to visit.     No Known Allergies  Family History  Problem Relation Age of Onset  . Diabetes Neg Hx     BP 108/78   Pulse 96   Wt 216 lb (98 kg)   SpO2 96%   BMI 34.60 kg/m    Review of Systems He denies hypoglycemia    Objective:   Physical Exam VITAL SIGNS:  See vs page GENERAL: no distress Pulses: foot pulses are intact bilaterally.   MSK: no deformity of the feet or ankles.  CV: no edema of the legs or ankles Skin:  no ulcer on the feet or ankles.  normal color and temp on the feet and ankles Neuro: sensation is  intact to touch on the feet and ankles.      Lab Results  Component Value Date   HGBA1C 7.6 03/02/2018      Assessment & Plan:  Type 1 DM, with polyneuropathy: he needs increased rx   Patient Instructions  Please continue these pump settings: basal rate of 1.6 units/hr.  bolus of 1 unit/9 grams carbohydrate.  continue correction bolus (which some people call "sensitivity," or "insulin sensitivity ratio," or just "isr") of 1 unit for each 25 by which your glucose exceeds 100.  For the Ramadan month, please take a basal rate of 1.2 units/hr, 24 HRS per day.  If it goes high, take a bolus as above.  If it goes low, drink a soft drink or glucose tablet.    Best wishes on your move to [place].

## 2018-05-19 IMAGING — DX DG LUMBAR SPINE COMPLETE 4+V
5 series · 5 of 5 positions shown · non-contrast
Comparison: None.

CLINICAL DATA: Chronic low back pain.

EXAM:
LUMBAR SPINE - COMPLETE 4+ VIEW

[l-spine ap]
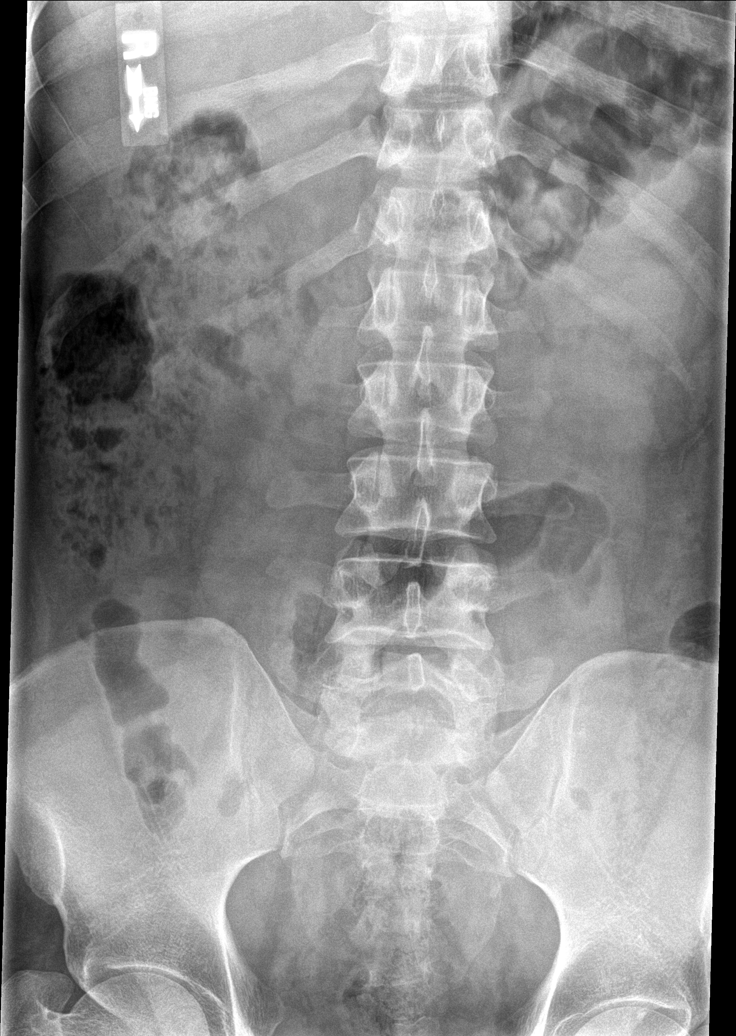

[l-spine obl (1 of 2)]
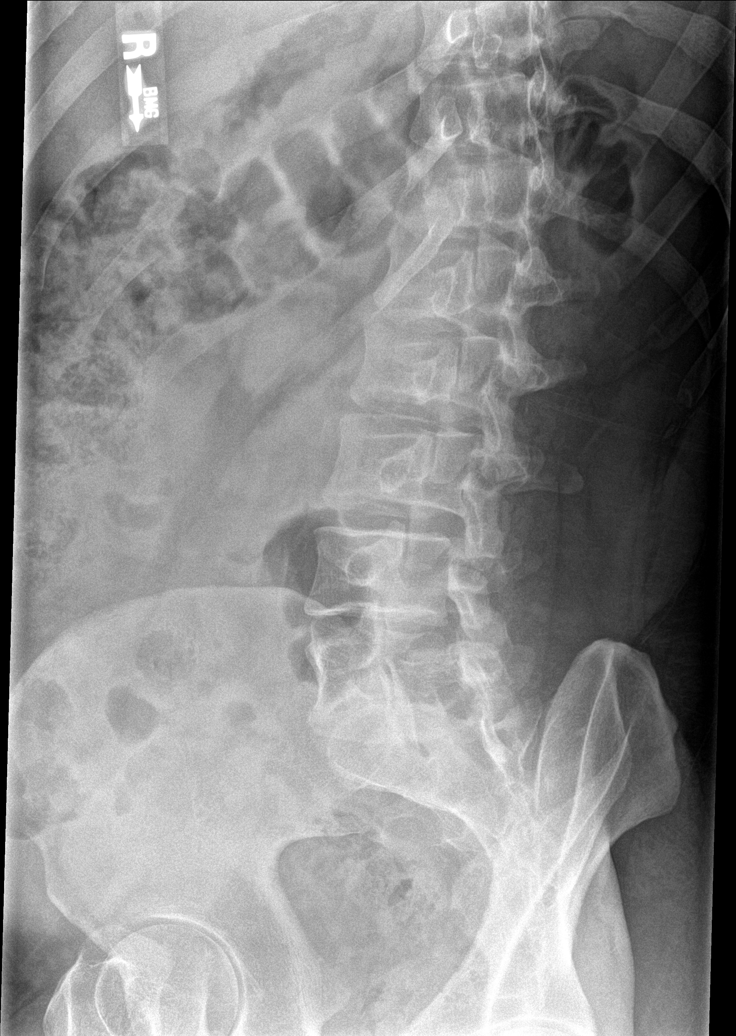

[l-spine obl (2 of 2)]
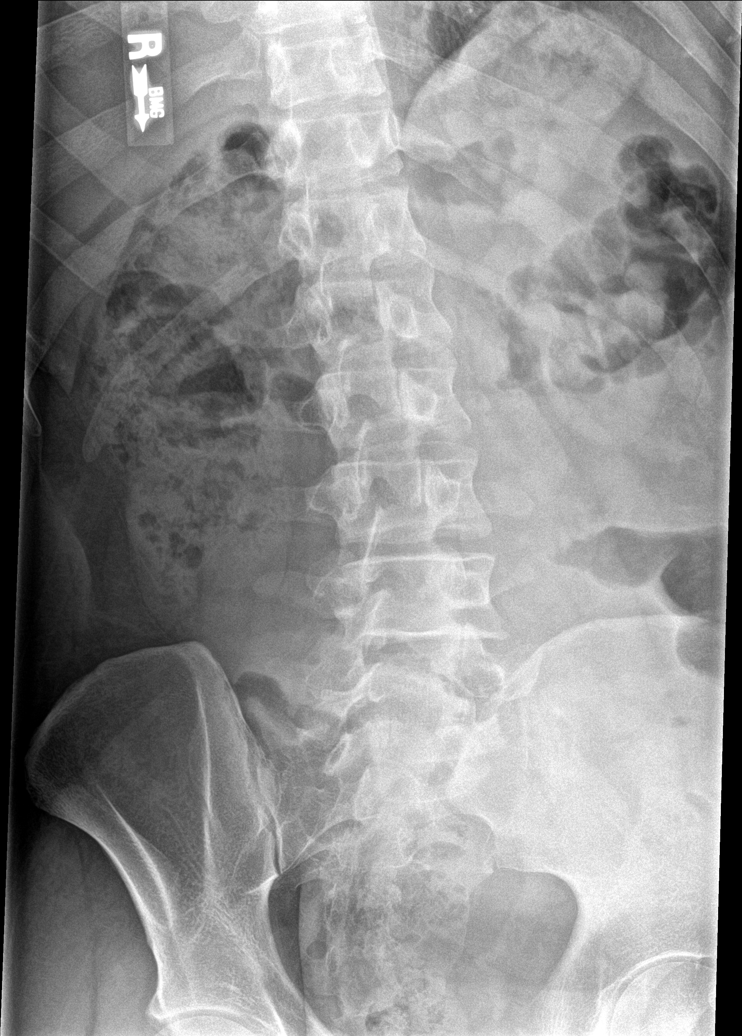

[l-spine lat]
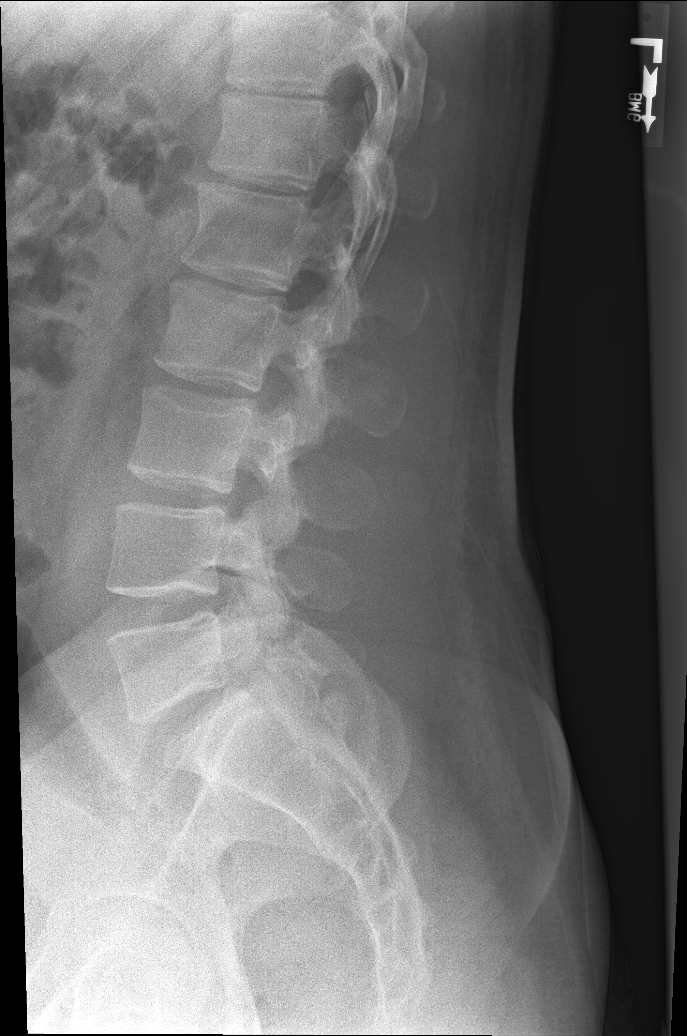

[l-spine l5-s1]
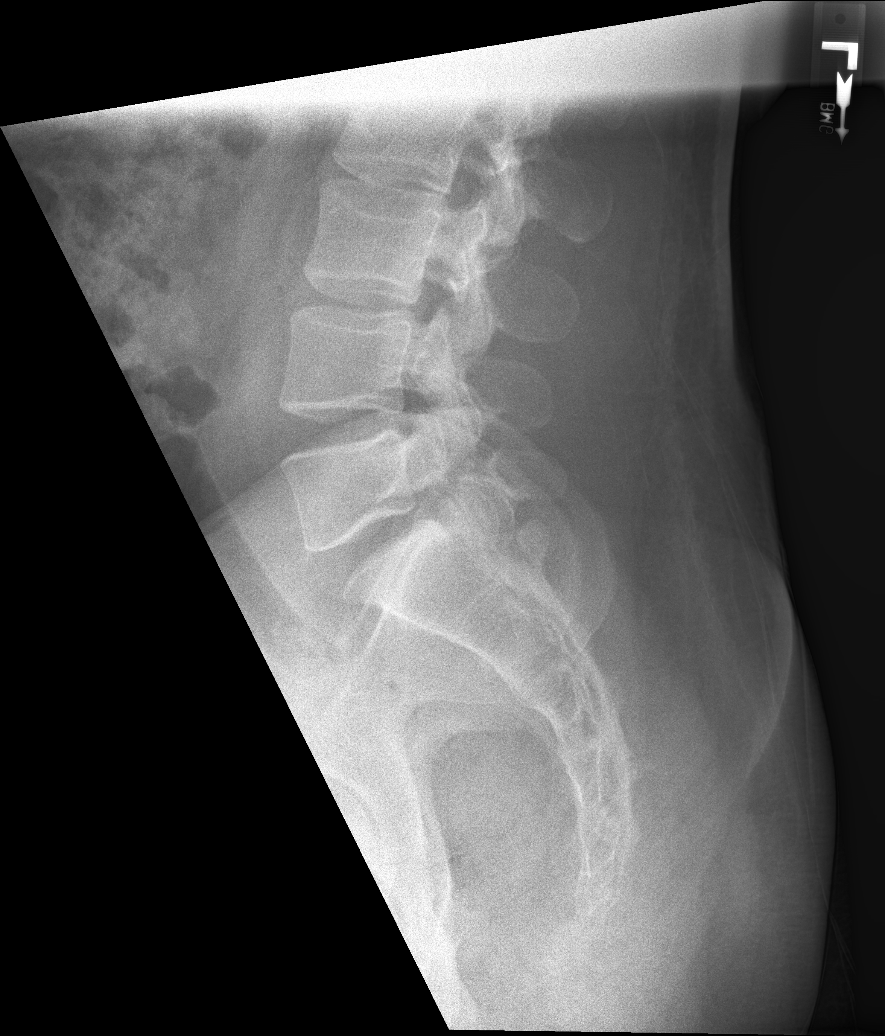

[5 of 5 positions shown; findings below may reference images not displayed]

FINDINGS: No evidence of lumbar spine fracture. BILATERAL L5 spondylolysis
with grade 1 spondylolisthesis, measures 3 mm. Intervertebral disc
spaces are preserved. Paravertebral soft tissues unremarkable.
IMPRESSION: No acute findings. BILATERAL L5 spondylolysis with grade 1
spondylolisthesis.

## 2018-05-27 ENCOUNTER — Other Ambulatory Visit: Payer: Self-pay | Admitting: Family Medicine

## 2018-05-29 NOTE — Telephone Encounter (Signed)
Vitamin D2 Ergocalciferol refill Last Refill:start 12/22/17 end date 02/11/18 #12 capsules 1 RF Last OV: 12/22/17 PCP: Norberto SorensonEva Shaw MD Pharmacy:[facility] [phone]

## 2018-05-30 NOTE — Telephone Encounter (Signed)
Refill request for vitamin d ergocalciferol 50000 units denied.  Medication no longer listed on medication list.  Pt will need to schedule appt for further refills.  Will send to scheduling pool to contact pt for ov/med refill appt with Clelia CroftShaw. Dgaddy, CMA
